# Patient Record
Sex: Female | Born: 1945
Health system: Southern US, Community
[De-identification: ages and names within clinical notes are randomized; demographics above are authoritative.]

## PROBLEM LIST (undated history)

## (undated) DIAGNOSIS — M1990 Unspecified osteoarthritis, unspecified site: Secondary | ICD-10-CM

## (undated) DIAGNOSIS — Z8669 Personal history of other diseases of the nervous system and sense organs: Secondary | ICD-10-CM

## (undated) DIAGNOSIS — T4145XA Adverse effect of unspecified anesthetic, initial encounter: Secondary | ICD-10-CM

## (undated) DIAGNOSIS — R112 Nausea with vomiting, unspecified: Secondary | ICD-10-CM

## (undated) DIAGNOSIS — Z9889 Other specified postprocedural states: Secondary | ICD-10-CM

## (undated) DIAGNOSIS — H269 Unspecified cataract: Secondary | ICD-10-CM

## (undated) DIAGNOSIS — M8580 Other specified disorders of bone density and structure, unspecified site: Secondary | ICD-10-CM

## (undated) DIAGNOSIS — G4700 Insomnia, unspecified: Secondary | ICD-10-CM

## (undated) DIAGNOSIS — T8859XA Other complications of anesthesia, initial encounter: Secondary | ICD-10-CM

## (undated) HISTORY — DX: Other specified disorders of bone density and structure, unspecified site: M85.80

## (undated) HISTORY — DX: Unspecified osteoarthritis, unspecified site: M19.90

## (undated) HISTORY — DX: Personal history of other diseases of the nervous system and sense organs: Z86.69

## (undated) HISTORY — PX: TUBAL LIGATION: SHX77

## (undated) HISTORY — DX: Unspecified cataract: H26.9

## (undated) HISTORY — PX: TONSILLECTOMY: SUR1361

## (undated) HISTORY — DX: Insomnia, unspecified: G47.00

---

## 2000-08-10 HISTORY — PX: BUNIONECTOMY: SHX129

## 2000-12-09 ENCOUNTER — Other Ambulatory Visit: Admission: RE | Admit: 2000-12-09 | Discharge: 2000-12-09 | Payer: Self-pay | Admitting: Internal Medicine

## 2001-11-13 ENCOUNTER — Other Ambulatory Visit: Admission: RE | Admit: 2001-11-13 | Discharge: 2001-11-13 | Payer: Self-pay | Admitting: Internal Medicine

## 2003-05-21 ENCOUNTER — Other Ambulatory Visit: Admission: RE | Admit: 2003-05-21 | Discharge: 2003-05-21 | Payer: Self-pay | Admitting: Internal Medicine

## 2003-06-11 ENCOUNTER — Encounter: Admission: RE | Admit: 2003-06-11 | Discharge: 2003-06-11 | Payer: Self-pay | Admitting: Internal Medicine

## 2003-06-11 ENCOUNTER — Encounter: Payer: Self-pay | Admitting: Internal Medicine

## 2003-12-13 ENCOUNTER — Encounter: Admission: RE | Admit: 2003-12-13 | Discharge: 2003-12-13 | Payer: Self-pay | Admitting: Internal Medicine

## 2004-06-06 ENCOUNTER — Other Ambulatory Visit: Admission: RE | Admit: 2004-06-06 | Discharge: 2004-06-06 | Payer: Self-pay | Admitting: Internal Medicine

## 2004-11-17 ENCOUNTER — Ambulatory Visit: Payer: Self-pay | Admitting: Sports Medicine

## 2004-12-18 ENCOUNTER — Encounter: Admission: RE | Admit: 2004-12-18 | Discharge: 2004-12-18 | Payer: Self-pay | Admitting: Internal Medicine

## 2005-07-05 ENCOUNTER — Other Ambulatory Visit: Admission: RE | Admit: 2005-07-05 | Discharge: 2005-07-05 | Payer: Self-pay | Admitting: Internal Medicine

## 2005-07-06 ENCOUNTER — Encounter: Admission: RE | Admit: 2005-07-06 | Discharge: 2005-07-06 | Payer: Self-pay | Admitting: Internal Medicine

## 2006-02-12 ENCOUNTER — Encounter: Admission: RE | Admit: 2006-02-12 | Discharge: 2006-02-12 | Payer: Self-pay | Admitting: Internal Medicine

## 2007-07-03 ENCOUNTER — Encounter: Admission: RE | Admit: 2007-07-03 | Discharge: 2007-07-03 | Payer: Self-pay | Admitting: Internal Medicine

## 2007-08-12 ENCOUNTER — Encounter: Admission: RE | Admit: 2007-08-12 | Discharge: 2007-08-12 | Payer: Self-pay | Admitting: Internal Medicine

## 2008-07-19 ENCOUNTER — Ambulatory Visit: Payer: Self-pay | Admitting: Internal Medicine

## 2008-07-19 ENCOUNTER — Other Ambulatory Visit: Admission: RE | Admit: 2008-07-19 | Discharge: 2008-07-19 | Payer: Self-pay | Admitting: Internal Medicine

## 2008-07-19 LAB — HM PAP SMEAR

## 2008-07-21 ENCOUNTER — Encounter: Admission: RE | Admit: 2008-07-21 | Discharge: 2008-07-21 | Payer: Self-pay | Admitting: Internal Medicine

## 2008-08-03 ENCOUNTER — Encounter: Admission: RE | Admit: 2008-08-03 | Discharge: 2008-08-03 | Payer: Self-pay | Admitting: Internal Medicine

## 2008-09-10 HISTORY — PX: CATARACT EXTRACTION: SUR2

## 2008-10-22 ENCOUNTER — Ambulatory Visit: Payer: Self-pay | Admitting: Internal Medicine

## 2008-11-11 ENCOUNTER — Ambulatory Visit: Payer: Self-pay | Admitting: Internal Medicine

## 2009-08-18 ENCOUNTER — Encounter: Admission: RE | Admit: 2009-08-18 | Discharge: 2009-08-18 | Payer: Self-pay | Admitting: Internal Medicine

## 2009-11-30 ENCOUNTER — Encounter: Admission: RE | Admit: 2009-11-30 | Discharge: 2009-11-30 | Payer: Self-pay | Admitting: Internal Medicine

## 2010-07-04 ENCOUNTER — Ambulatory Visit: Payer: Self-pay | Admitting: Internal Medicine

## 2010-08-08 ENCOUNTER — Ambulatory Visit: Payer: Self-pay | Admitting: Internal Medicine

## 2010-08-21 ENCOUNTER — Ambulatory Visit: Payer: Self-pay | Admitting: Internal Medicine

## 2010-08-24 ENCOUNTER — Encounter
Admission: RE | Admit: 2010-08-24 | Discharge: 2010-08-24 | Payer: Self-pay | Source: Home / Self Care | Attending: Internal Medicine | Admitting: Internal Medicine

## 2010-10-01 ENCOUNTER — Encounter: Payer: Self-pay | Admitting: Internal Medicine

## 2011-08-15 ENCOUNTER — Encounter: Payer: Self-pay | Admitting: Internal Medicine

## 2011-08-16 ENCOUNTER — Other Ambulatory Visit: Payer: Medicare Other | Admitting: Internal Medicine

## 2011-08-16 DIAGNOSIS — Z0000 Encounter for general adult medical examination without abnormal findings: Secondary | ICD-10-CM

## 2011-08-16 DIAGNOSIS — E559 Vitamin D deficiency, unspecified: Secondary | ICD-10-CM

## 2011-08-17 ENCOUNTER — Other Ambulatory Visit: Payer: Self-pay | Admitting: Internal Medicine

## 2011-08-17 LAB — COMPREHENSIVE METABOLIC PANEL
AST: 19 U/L (ref 0–37)
Albumin: 4.4 g/dL (ref 3.5–5.2)
Alkaline Phosphatase: 59 U/L (ref 39–117)
BUN: 16 mg/dL (ref 6–23)
CO2: 29 mEq/L (ref 19–32)
Calcium: 8.9 mg/dL (ref 8.4–10.5)
Chloride: 104 mEq/L (ref 96–112)
Creat: 0.91 mg/dL (ref 0.50–1.10)
Glucose, Bld: 71 mg/dL (ref 70–99)
Potassium: 4.8 mEq/L (ref 3.5–5.3)
Sodium: 140 mEq/L (ref 135–145)
Total Bilirubin: 0.9 mg/dL (ref 0.3–1.2)

## 2011-08-17 LAB — VITAMIN D 25 HYDROXY (VIT D DEFICIENCY, FRACTURES): Vit D, 25-Hydroxy: 43 ng/mL (ref 30–89)

## 2011-08-17 LAB — TSH: TSH: 2.099 u[IU]/mL (ref 0.350–4.500)

## 2011-08-17 LAB — CBC WITH DIFFERENTIAL/PLATELET
Basophils Absolute: 0.1 10*3/uL (ref 0.0–0.1)
Eosinophils Absolute: 0.2 10*3/uL (ref 0.0–0.7)
HCT: 41.6 % (ref 36.0–46.0)
MCHC: 33.2 g/dL (ref 30.0–36.0)
MCV: 94.3 fL (ref 78.0–100.0)
Neutrophils Relative %: 54 % (ref 43–77)
RBC: 4.41 MIL/uL (ref 3.87–5.11)

## 2011-08-20 ENCOUNTER — Encounter: Payer: Self-pay | Admitting: Internal Medicine

## 2011-08-20 ENCOUNTER — Ambulatory Visit (INDEPENDENT_AMBULATORY_CARE_PROVIDER_SITE_OTHER): Payer: Medicare Other | Admitting: Internal Medicine

## 2011-08-20 ENCOUNTER — Other Ambulatory Visit (HOSPITAL_COMMUNITY)
Admission: RE | Admit: 2011-08-20 | Discharge: 2011-08-20 | Disposition: A | Discharge status: Home / Self Care | Payer: Medicare Other | Source: Ambulatory Visit | Attending: Internal Medicine | Admitting: Internal Medicine

## 2011-08-20 VITALS — BP 114/66 | HR 76 | Temp 98.1°F | Ht 63.0 in | Wt 144.0 lb

## 2011-08-20 DIAGNOSIS — Z23 Encounter for immunization: Secondary | ICD-10-CM

## 2011-08-20 DIAGNOSIS — M8580 Other specified disorders of bone density and structure, unspecified site: Secondary | ICD-10-CM

## 2011-08-20 DIAGNOSIS — F419 Anxiety disorder, unspecified: Secondary | ICD-10-CM

## 2011-08-20 DIAGNOSIS — Z0000 Encounter for general adult medical examination without abnormal findings: Secondary | ICD-10-CM

## 2011-08-20 DIAGNOSIS — Z124 Encounter for screening for malignant neoplasm of cervix: Secondary | ICD-10-CM | POA: Insufficient documentation

## 2011-08-20 DIAGNOSIS — F458 Other somatoform disorders: Secondary | ICD-10-CM

## 2011-08-20 DIAGNOSIS — G4700 Insomnia, unspecified: Secondary | ICD-10-CM

## 2011-08-20 DIAGNOSIS — M26629 Arthralgia of temporomandibular joint, unspecified side: Secondary | ICD-10-CM

## 2011-08-20 LAB — POCT URINALYSIS DIPSTICK
Glucose, UA: NEGATIVE
Ketones, UA: NEGATIVE
Leukocytes, UA: NEGATIVE
Protein, UA: NEGATIVE
Spec Grav, UA: <=1.005
Urobilinogen, UA: NEGATIVE
pH, UA: 6.5

## 2011-09-10 NOTE — Patient Instructions (Signed)
Return in one year.

## 2011-09-12 ENCOUNTER — Other Ambulatory Visit: Payer: Self-pay | Admitting: Internal Medicine

## 2011-09-12 DIAGNOSIS — Z139 Encounter for screening, unspecified: Secondary | ICD-10-CM

## 2011-09-17 ENCOUNTER — Ambulatory Visit
Admission: RE | Admit: 2011-09-17 | Discharge: 2011-09-17 | Disposition: A | Discharge status: Home / Self Care | Payer: Medicare Other | Source: Ambulatory Visit | Attending: Internal Medicine | Admitting: Internal Medicine

## 2011-09-17 DIAGNOSIS — Z139 Encounter for screening, unspecified: Secondary | ICD-10-CM

## 2011-10-25 ENCOUNTER — Other Ambulatory Visit: Payer: Self-pay | Admitting: Dermatology

## 2011-11-14 ENCOUNTER — Encounter: Payer: Self-pay | Admitting: Internal Medicine

## 2011-11-14 NOTE — Progress Notes (Signed)
  Subjective:    Patient ID: Faith Miles, female    DOB: 05/11/1946, 66 y.o.   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 Had tubal ligation 1982. Tonsillectomy in childhood. Took estrogen replacement for many years. She is off that now. Is taking Fosamax for a number of years for osteopenia. History of glaucoma. Had left Bell's palsy August 2003. Colonoscopy by Dr. Juanda Chance October 2004. Last bone density study December 2008. History of surgery for bilateral bunions by Dr. Bari Edward December 2001.  Pneumovax vaccine 07/04/2010, Zostavax vaccine 08/08/2010. DTaP vaccine February 2011. Diagnosed by orthopedist with osteoarthritis left knee. Treated with Naprosyn. Patient tries to exercise with treadmill.  History of bruxism and TMJ syndrome. History of anxiety.  Family history mother died at age 18 with Alzheimer's disease.; Follow died at age 78 with diabetes and coronary artery disease. Both parents have hypertension. 3 sisters in good  health. One daughter and one son.  Social history patient drinks 1-2 ounces of wine or liquor daily. Has occasionally smoked in the remote past. Has 4 year college degree. Is married. Husband also worked for [employer] before retiring.    Review of Systems  Constitutional: Negative.   HENT: Negative.   Eyes: Negative.   Respiratory: Negative.   Gastrointestinal: Negative.   Genitourinary: Negative.   Musculoskeletal:       Knee pain  Neurological: Negative.   Psychiatric/Behavioral:       History of anxiety, insomnia, bruxism       Objective:   Physical Exam  Nursing note and vitals reviewed. Constitutional: She appears well-developed. No distress.  HENT:  Head: Normocephalic and atraumatic.  Right Ear: External ear normal.  Left Ear: External ear normal.  Mouth/Throat: Oropharynx is clear and moist.  Eyes: Conjunctivae and  EOM are normal. Pupils are equal, round, and reactive to light. Right eye exhibits no discharge. Left eye exhibits no discharge.  Neck: Normal range of motion. Neck supple. No JVD present. No thyromegaly present.  Cardiovascular: Normal rate, regular rhythm, normal heart sounds and intact distal pulses.   No murmur heard. Pulmonary/Chest: Effort normal and breath sounds normal. She has no wheezes. She has no rales.  Abdominal: Soft. Bowel sounds are normal. She exhibits no mass. There is no tenderness. There is no rebound.  Genitourinary: No vaginal discharge found.       Pap last done in November 2009. Pap taken today. Bimanual normal.  Musculoskeletal: Normal range of motion. She exhibits no edema.  Lymphadenopathy:    She has no cervical adenopathy.  Neurological: She is alert. She has normal reflexes. She displays normal reflexes. No cranial nerve deficit. Coordination normal.  Skin: Skin is warm and dry. She is not diaphoretic.  Psychiatric: She has a normal mood and affect. Her behavior is normal.          Assessment & Plan:  History of anxiety  History of bruxism and TMJ syndrome  History of osteopenia  History of glaucoma  Plan: Patient to have bone density study and mammogram. Colonoscopy due in 2014. Immunizations are up-to-date. Return one year or when necessary.

## 2012-07-25 ENCOUNTER — Encounter: Payer: Self-pay | Admitting: Internal Medicine

## 2012-07-25 ENCOUNTER — Ambulatory Visit (INDEPENDENT_AMBULATORY_CARE_PROVIDER_SITE_OTHER): Payer: Medicare Other | Admitting: Internal Medicine

## 2012-07-25 VITALS — BP 116/70 | HR 84 | Temp 100.0°F | Wt 144.0 lb

## 2012-07-25 DIAGNOSIS — J329 Chronic sinusitis, unspecified: Secondary | ICD-10-CM

## 2012-07-25 NOTE — Patient Instructions (Addendum)
Take Zithromax 2 tablets day one followed by 1 tablet days 2 through 5. Tessalon Perles 100 mg 2 tablets by mouth 3 times daily as needed for cough. Call if not better in one week or sooner if worse

## 2012-07-25 NOTE — Progress Notes (Signed)
  Subjective:    Patient ID: Faith Miles, female    DOB: 13-Feb-1946, 66 y.o.   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 Has had fever. Last night was cold but had no shaking chills. Temp is been up to 101. Has had some discolored nasal drainage. Ears feel a bit full. No sore throat. Cough is dry.   Review of Systems     Objective:   Physical Exam HEENT exam: TMs and pharynx are clear, neck is supple without significant adenopathy. Sounds nasally congested. Skin is warm and dry. Alert oriented x3. Chest clear to auscultation without rales or wheezing        Assessment & Plan:  Sinusitis  Plan: Zithromax Z-Pak take 2 tablets day one followed by 1 tablet days 2 through 5. Tessalon Perles 100 mg ( #60) 2 by mouth 3 times a day when necessary cough. Call if not better in one week. Rest and drink plenty of fluids. Has appointment for physical examination in December.

## 2012-08-28 ENCOUNTER — Other Ambulatory Visit: Payer: Medicare Other | Admitting: Internal Medicine

## 2012-08-29 ENCOUNTER — Encounter: Payer: Medicare Other | Admitting: Internal Medicine

## 2012-09-19 ENCOUNTER — Other Ambulatory Visit: Payer: Self-pay | Admitting: Internal Medicine

## 2012-09-19 DIAGNOSIS — Z1231 Encounter for screening mammogram for malignant neoplasm of breast: Secondary | ICD-10-CM

## 2012-10-03 ENCOUNTER — Other Ambulatory Visit: Payer: Medicare Other | Admitting: Internal Medicine

## 2012-10-06 ENCOUNTER — Encounter: Payer: Medicare Other | Admitting: Internal Medicine

## 2012-10-07 ENCOUNTER — Other Ambulatory Visit: Payer: Medicare Other | Admitting: Internal Medicine

## 2012-10-07 DIAGNOSIS — Z0000 Encounter for general adult medical examination without abnormal findings: Secondary | ICD-10-CM

## 2012-10-07 LAB — CBC WITH DIFFERENTIAL/PLATELET
Basophils Absolute: 0.1 10*3/uL (ref 0.0–0.1)
Basophils Relative: 1 % (ref 0–1)
HCT: 40.9 % (ref 36.0–46.0)
MCHC: 33.7 g/dL (ref 30.0–36.0)
MCV: 90.1 fL (ref 78.0–100.0)
Monocytes Absolute: 0.5 10*3/uL (ref 0.1–1.0)
Monocytes Relative: 8 % (ref 3–12)
Neutro Abs: 3.4 10*3/uL (ref 1.7–7.7)
RDW: 14 % (ref 11.5–15.5)
WBC: 6.1 10*3/uL (ref 4.0–10.5)

## 2012-10-07 LAB — LIPID PANEL
Cholesterol: 222 mg/dL — ABNORMAL HIGH (ref 0–200)
Total CHOL/HDL Ratio: 2.9 Ratio
VLDL: 24 mg/dL (ref 0–40)

## 2012-10-07 LAB — COMPREHENSIVE METABOLIC PANEL
ALT: 15 U/L (ref 0–35)
AST: 18 U/L (ref 0–37)
Alkaline Phosphatase: 65 U/L (ref 39–117)
BUN: 13 mg/dL (ref 6–23)
CO2: 30 mEq/L (ref 19–32)
Calcium: 9.3 mg/dL (ref 8.4–10.5)
Creat: 0.77 mg/dL (ref 0.50–1.10)
Sodium: 140 mEq/L (ref 135–145)
Total Protein: 6.9 g/dL (ref 6.0–8.3)

## 2012-10-09 ENCOUNTER — Ambulatory Visit (INDEPENDENT_AMBULATORY_CARE_PROVIDER_SITE_OTHER): Payer: Medicare Other | Admitting: Internal Medicine

## 2012-10-09 ENCOUNTER — Other Ambulatory Visit (HOSPITAL_COMMUNITY)
Admission: RE | Admit: 2012-10-09 | Discharge: 2012-10-09 | Disposition: A | Discharge status: Home / Self Care | Payer: Medicare Other | Source: Ambulatory Visit | Attending: Internal Medicine | Admitting: Internal Medicine

## 2012-10-09 ENCOUNTER — Ambulatory Visit: Payer: Medicare Other

## 2012-10-09 ENCOUNTER — Encounter: Payer: Self-pay | Admitting: Internal Medicine

## 2012-10-09 ENCOUNTER — Ambulatory Visit
Admission: RE | Admit: 2012-10-09 | Discharge: 2012-10-09 | Disposition: A | Discharge status: Home / Self Care | Payer: Medicare Other | Source: Ambulatory Visit | Attending: Internal Medicine | Admitting: Internal Medicine

## 2012-10-09 VITALS — BP 112/66 | HR 76 | Ht 63.0 in | Wt 147.5 lb

## 2012-10-09 DIAGNOSIS — E785 Hyperlipidemia, unspecified: Secondary | ICD-10-CM | POA: Insufficient documentation

## 2012-10-09 DIAGNOSIS — H04123 Dry eye syndrome of bilateral lacrimal glands: Secondary | ICD-10-CM

## 2012-10-09 DIAGNOSIS — H9190 Unspecified hearing loss, unspecified ear: Secondary | ICD-10-CM | POA: Insufficient documentation

## 2012-10-09 DIAGNOSIS — M899 Disorder of bone, unspecified: Secondary | ICD-10-CM

## 2012-10-09 DIAGNOSIS — H04129 Dry eye syndrome of unspecified lacrimal gland: Secondary | ICD-10-CM

## 2012-10-09 DIAGNOSIS — Z1231 Encounter for screening mammogram for malignant neoplasm of breast: Secondary | ICD-10-CM

## 2012-10-09 DIAGNOSIS — Z85828 Personal history of other malignant neoplasm of skin: Secondary | ICD-10-CM | POA: Insufficient documentation

## 2012-10-09 DIAGNOSIS — E559 Vitamin D deficiency, unspecified: Secondary | ICD-10-CM

## 2012-10-09 DIAGNOSIS — R8290 Unspecified abnormal findings in urine: Secondary | ICD-10-CM

## 2012-10-09 DIAGNOSIS — Z0000 Encounter for general adult medical examination without abnormal findings: Secondary | ICD-10-CM

## 2012-10-09 DIAGNOSIS — F419 Anxiety disorder, unspecified: Secondary | ICD-10-CM

## 2012-10-09 DIAGNOSIS — R319 Hematuria, unspecified: Secondary | ICD-10-CM | POA: Insufficient documentation

## 2012-10-09 DIAGNOSIS — F411 Generalized anxiety disorder: Secondary | ICD-10-CM

## 2012-10-09 DIAGNOSIS — R82998 Other abnormal findings in urine: Secondary | ICD-10-CM

## 2012-10-09 DIAGNOSIS — M26629 Arthralgia of temporomandibular joint, unspecified side: Secondary | ICD-10-CM

## 2012-10-09 DIAGNOSIS — M8580 Other specified disorders of bone density and structure, unspecified site: Secondary | ICD-10-CM

## 2012-10-09 LAB — POCT URINALYSIS DIPSTICK
Bilirubin, UA: NEGATIVE
Glucose, UA: NEGATIVE
Leukocytes, UA: NEGATIVE
Nitrite, UA: NEGATIVE
Urobilinogen, UA: NEGATIVE

## 2012-10-09 NOTE — Patient Instructions (Addendum)
Watch diet and exercise. Take 2000 units Vitamin D3 over the counter daily. Have colonoscopy this fall. Return in one year. Urine is being evaluated because of occult blood.  Addendum: Urine microscopic shows no evidence of significant hematuria. Urine culture shows no growth. Reevaluate in one year.

## 2012-10-09 NOTE — Progress Notes (Signed)
Subjective:    Patient ID: Faith Miles, female    DOB: May 10, 1946, 67 y.o.   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 She had tubal ligation 1982. Tonsillectomy in childhood. She took estrogen replacement for many years but currently is off that. Took Fosamax for osteopenia. History of glaucoma. Had left Bell's palsy August 2003. Colonoscopy done by Dr. Juanda Chance October 2004. Last bone density study December 2008. History of surgery for bilateral bunions by podiatrist in 2001.  Pneumovax vaccine October 2011. Zostavax vaccine November 2011. Tetanus immunization February 2011.  Has been diagnosed by orthopedist with osteoarthritis of her left knee. Treated with Naprosyn. Tries to exercise with treadmill.  History of anxiety, bruxism TMJ syndrome.  Family history: Mother died at age 33 with Alzheimer's disease. Father died at age 73 with diabetes and coronary disease. Both parents with history of hypertension. 3 sisters in good health. One daughter is bipolar. Has one son who is well.  Social history: Drinks one or 2 ounces of wine or liquor daily. Has occasionally smoked in the remote past. 4 year college degree. Is married. Husband also retired and formerly worked for [employer] before retiring.  Has recently been diagnosed with hearing loss and will be getting hearing aids from [employer].  Diagnosed with squamous cell carcinoma right leg by dermatologist February 2013.  Last Pap 2012   Review of Systems  Constitutional: Negative.   All other systems reviewed and are negative.       Objective:   Physical Exam  Vitals reviewed. Constitutional: She is oriented to person, place, and time. She appears well-developed and well-nourished. No distress.  HENT:  Head: Normocephalic and atraumatic.  Right Ear: External ear normal.  Left Ear: External ear normal.  Mouth/Throat: Oropharynx is clear and moist.  No oropharyngeal exudate.  Eyes: Conjunctivae normal and EOM are normal. Pupils are equal, round, and reactive to light. Right eye exhibits no discharge. Left eye exhibits no discharge. No scleral icterus.  Neck: Neck supple. No JVD present. No thyromegaly present.  Cardiovascular: Normal rate, regular rhythm, normal heart sounds and intact distal pulses.   No murmur heard. Pulmonary/Chest: Effort normal and breath sounds normal. No respiratory distress. She has no wheezes. She has no rales. She exhibits no tenderness.  Abdominal: Soft. Bowel sounds are normal. She exhibits no distension and no mass. There is no tenderness. There is no rebound and no guarding.  Genitourinary:        Pap deferred. Last done 2012  Musculoskeletal: Normal range of motion. She exhibits no edema.  Lymphadenopathy:    She has no cervical adenopathy.  Neurological: She is alert and oriented to person, place, and time. She has normal reflexes. No cranial nerve deficit. Coordination normal.  Skin: Skin is warm and dry. No rash noted. She is not diaphoretic.  Psychiatric: She has a normal mood and affect. Her behavior is normal. Judgment and thought content normal.          Assessment & Plan:    Occult blood in urine on urine dipstick-microscopic urinalysis at [facility] lab, urine culture, urine cytology. She's had trace amounts of occult blood in urine in the past but not 2+ as she has today. She is asymptomatic. Consider urology consultation. Not taking aspirin Aleve or Advil. Is menopausal.  Hyperlipidemia-recommend diet and exercise and recheck in one year  History of basal cell carcinoma behind right ear and leg (Feb 2013) followed  by Dr. Yetta Barre at [facility]  Hearing loss- to get hearing aids at [facility]  Vitamin D deficiency -- take 2000 units vitamin D 3 daily over-the-counter   History of anxiety  History of TMJ syndrome  Mild osteopenia-positive response to Fosamax. Recommend now that she  take calcium and vitamin D. Last bone density 2011. Should have another bone density study 2014.    Addendum: Urine was sent to [facility] lab for microscopic urinalysis showing no significant hematuria. Culture was no growth. Therefore do not feel further intervention is necessary at this point in time.  Subjective:   Patient presents for Medicare Annual/Subsequent preventive examination.   Review Past Medical/Family/Social:   Risk Factors  Current exercise habits: sedentary except for walking Dietary issues discussed: low fat low carb  Cardiac risk factors: hyperlipidemia  Depression Screen  (Note: if answer to either of the following is "Yes", a more complete depression screening is indicated)   Over the past two weeks, have you felt down, depressed or hopeless? No  Over the past two weeks, have you felt little interest or pleasure in doing things? No Have you lost interest or pleasure in daily life? No Do you often feel hopeless? No Do you cry easily over simple problems? No   Activities of Daily Living  In your present state of health, do you have any difficulty performing the following activities?:   Driving? No  Managing money? No  Feeding yourself? No  Getting from bed to chair? No  Climbing a flight of stairs? No  Preparing food and eating?: No  Bathing or showering? No  Getting dressed: No  Getting to the toilet? No  Using the toilet:No  Moving around from place to place: No  In the past year have you fallen or had a near fall?:No  Are you sexually active? No  Do you have more than one partner? No   Hearing Difficulties: No  Do you often ask people to speak up or repeat themselves? Yes- obtaining hearing aids at [facility]  Do you experience ringing or noises in your ears? No  Do you have difficulty understanding soft or whispered voices? yes Do you feel that you have a problem with memory? No Do you often misplace items? No    Home Safety:  Do you have a  smoke alarm at your residence? Yes Do you have grab bars in the bathroom? yes Do you have throw rugs in your house? yes   Cognitive Testing  Alert? Yes Normal Appearance?Yes  Oriented to person? Yes Place? Yes  Time? Yes  Recall of three objects? Yes  Can perform simple calculations? Yes  Displays appropriate judgment?Yes  Can read the correct time from a watch face?Yes   List the Names of Other Physician/Practitioners you currently use:  See referral list for the physicians patient is currently seeing. Dr. Yetta Barre- Dermatology; Dr. Juanda Chance for routine colonoscopy due Oct 2014    Review of Systems:   Objective:     General appearance: Appears stated age in Head: Normocephalic, without obvious abnormality, atraumatic  Eyes: conj clear, EOMi PEERLA  Ears: normal TM's and external ear canals both ears  Nose: Nares normal. Septum midline. Mucosa normal. No drainage or sinus tenderness.  Throat: lips, mucosa, and tongue normal; teeth and gums normal  Neck: no adenopathy, no carotid bruit, no JVD, supple, symmetrical, trachea midline and thyroid not enlarged, symmetric, no tenderness/mass/nodules  No CVA tenderness.  Lungs: clear to auscultation bilaterally  Breasts: normal appearance, no  masses or tenderness,  Heart: regular rate and rhythm, S1, S2 normal, no murmur, click, rub or gallop  Abdomen: soft, non-tender; bowel sounds normal; no masses, no organomegaly  Musculoskeletal: ROM normal in all joints, no crepitus, no deformity, Normal muscle strengthen. Back  is symmetric, no curvature. Skin: Skin color, texture, turgor normal. No rashes or lesions  Lymph nodes: Cervical, supraclavicular, and axillary nodes normal.  Neurologic: CN 2 -12 Normal, Normal symmetric reflexes. Normal coordination and gait  Psych: Alert & Oriented x 3, Mood appear stable.    Assessment:    Annual wellness medicare exam   Plan:    During the course of the visit the patient was educated and  counseled about appropriate screening and preventive services including:   Mammogram to be done today and colonoscopy due Fall 2014     Patient Instructions (the written plan) was given to the patient.  Medicare Attestation  I have personally reviewed:  The patient's medical and social history  Their use of alcohol, tobacco or illicit drugs  Their current medications and supplements  The patient's functional ability including ADLs,fall risks, home safety risks, cognitive, and hearing and visual impairment  Diet and physical activities  Evidence for depression or mood disorders  The patient's weight, height, BMI, and visual acuity have been recorded in the chart. I have made referrals, counseling, and provided education to the patient based on review of the above and I have provided the patient with a written personalized care plan for preventive services.

## 2012-10-10 LAB — URINALYSIS, MICROSCOPIC ONLY: Crystals: NONE SEEN

## 2012-10-10 NOTE — Progress Notes (Signed)
Patient informed. 

## 2012-10-11 LAB — URINE CULTURE
Colony Count: NO GROWTH
Organism ID, Bacteria: NO GROWTH

## 2012-10-12 DIAGNOSIS — H04123 Dry eye syndrome of bilateral lacrimal glands: Secondary | ICD-10-CM | POA: Insufficient documentation

## 2012-10-12 DIAGNOSIS — M8580 Other specified disorders of bone density and structure, unspecified site: Secondary | ICD-10-CM | POA: Insufficient documentation

## 2012-10-12 DIAGNOSIS — F419 Anxiety disorder, unspecified: Secondary | ICD-10-CM | POA: Insufficient documentation

## 2012-10-12 DIAGNOSIS — M26629 Arthralgia of temporomandibular joint, unspecified side: Secondary | ICD-10-CM | POA: Insufficient documentation

## 2012-10-14 ENCOUNTER — Telehealth: Payer: Self-pay | Admitting: Internal Medicine

## 2012-10-14 NOTE — Telephone Encounter (Signed)
Patient called about results of urine tests. Returned call but could got answer machine. Left message that there is no significant blood in her urine on urine microscopic. Urine cytology is negative. Urine culture is negative.

## 2012-11-21 ENCOUNTER — Telehealth: Payer: Self-pay | Admitting: Internal Medicine

## 2012-11-21 NOTE — Telephone Encounter (Signed)
I do not recommend

## 2013-03-28 ENCOUNTER — Emergency Department (HOSPITAL_COMMUNITY)
Admission: EM | Admit: 2013-03-28 | Discharge: 2013-03-28 | Disposition: A | Discharge status: Home / Self Care | Payer: BC Managed Care – HMO | Source: Home / Self Care | Attending: Emergency Medicine | Admitting: Emergency Medicine

## 2013-03-28 ENCOUNTER — Encounter (HOSPITAL_COMMUNITY): Payer: Self-pay | Admitting: Emergency Medicine

## 2013-03-28 DIAGNOSIS — IMO0002 Reserved for concepts with insufficient information to code with codable children: Secondary | ICD-10-CM

## 2013-03-28 DIAGNOSIS — T148XXA Other injury of unspecified body region, initial encounter: Secondary | ICD-10-CM

## 2013-03-28 NOTE — ED Notes (Signed)
Reports cut to right pinky finger today around 3 p.m states cut finger while using pruner Pt states that she does not remember when she had her last tetanus shot Bleeding is controlled and finger wrapped in tape bandage.

## 2013-03-28 NOTE — ED Provider Notes (Signed)
Chief Complaint:   Chief Complaint  Patient presents with  . Laceration    cut to right pinky finger    History of Present Illness:   Faith Miles is a 67 year old female who cut her little finger around 3:30 this afternoon with a pitch clippers. She has a small, shallow laceration on the tip of the finger. She has full range of motion of all her digits. Sensation is intact. Her last tetanus vaccine was in 2011.  Review of Systems:  Other than noted above, the patient denies any of the following symptoms: Systemic:  No fever or chills. Musculoskeletal:  No joint pain or decreased range of motion. Neuro:  No numbness, tingling, or weakness.  PMFSH:  Past medical history, family history, social history, meds, and allergies were reviewed.   Physical Exam:   Vital signs:  BP 148/82  Pulse 66  Temp(Src) 98.2 F (36.8 C) (Oral)  Resp 17  SpO2 96% Ext:  There is a 1.5 cm laceration on the ulnar aspect of the right little finger, at the tip of the finger.  All joints have full range of motion and sensation is intact on the tip of the finger. All other joints had a full ROM without pain.  Pulses were full.  Good capillary refill in all digits.  No edema. Neurological:  Alert and oriented.  No muscle weakness.  Sensation was intact to light touch.   Procedure: Verbal informed consent was obtained.  The patient was informed of the risks and benefits of the procedure and understands and accepts.  Identity of the patient was verified verbally and by wristband.   The laceration area described above was prepped with Betadine and anesthetized with 2 mL of 2% Xylocaine without epinephrine.  The wound was then closed as follows:  Wound edges were approximated with 6 5-0 nylon sutures.  There were no immediate complications, and the patient tolerated the procedure well. The laceration was then cleansed, Bacitracin ointment was applied and a clean, dry pressure dressing was put on.   Assessment:  The  encounter diagnosis was Laceration.  Plan:   1.  The following meds were prescribed:   Discharge Medication List as of 03/28/2013  6:02 PM     2.  The patient was instructed in wound care and pain control, and handouts were given. 3.  The patient was told to return in 14 days for suture removal or wound recheck or sooner if any sign of infection.     Reuben Likes, MD 03/28/13 [phone]

## 2013-04-10 ENCOUNTER — Telehealth: Payer: Self-pay

## 2013-04-10 ENCOUNTER — Ambulatory Visit: Payer: Self-pay | Admitting: Internal Medicine

## 2013-04-10 NOTE — Telephone Encounter (Signed)
Need to know when they were placed.

## 2013-04-10 NOTE — Telephone Encounter (Signed)
Patient declines OV at 2:30 today. Needs to be out of town later today

## 2013-04-10 NOTE — Telephone Encounter (Signed)
pt went to urgent care an had 6 stiches put in pinky finger an are ready to be removed an wanting to see if possible she may come by today to get them removed

## 2013-04-12 ENCOUNTER — Emergency Department (INDEPENDENT_AMBULATORY_CARE_PROVIDER_SITE_OTHER)
Admission: EM | Admit: 2013-04-12 | Discharge: 2013-04-12 | Disposition: A | Discharge status: Home / Self Care | Payer: Medicare Other | Source: Home / Self Care | Attending: Emergency Medicine | Admitting: Emergency Medicine

## 2013-04-12 ENCOUNTER — Encounter (HOSPITAL_COMMUNITY): Payer: Self-pay | Admitting: *Deleted

## 2013-04-12 DIAGNOSIS — T148XXA Other injury of unspecified body region, initial encounter: Secondary | ICD-10-CM

## 2013-04-12 DIAGNOSIS — IMO0002 Reserved for concepts with insufficient information to code with codable children: Secondary | ICD-10-CM

## 2013-04-12 NOTE — ED Notes (Signed)
Presents for suture removal left 5th finger, placed 7/19.  Wound well-approximated without S/S infection.

## 2013-04-12 NOTE — ED Notes (Signed)
Sutures removed per Dr. Lorenz Coaster.

## 2013-04-12 NOTE — ED Provider Notes (Signed)
Chief Complaint:   Chief Complaint  Patient presents with  . Suture / Staple Removal    History of Present Illness:   Faith Miles is a 67 year old female who underwent laceration repair to her left little finger 2 weeks ago. She returns today for suture removal. It's healing up well with no evidence of infection.  Review of Systems:  Other than noted above, the patient denies any of the following symptoms: Systemic:  No fever or chills. Musculoskeletal:  No joint pain or decreased range of motion. Neuro:  No numbness, tingling, or weakness.  PMFSH:  Past medical history, family history, social history, meds, and allergies were reviewed.   Physical Exam:   Vital signs:  BP 124/76  Pulse 75  Temp(Src) 98.6 F (37 C) (Oral)  Resp 15  SpO2 96% Ext:  Laceration of the finger is well healed, no evidence of infection.  All other joints had a full ROM without pain.  Pulses were full.  Good capillary refill in all digits.  No edema. Neurological:  Alert and oriented.  No muscle weakness.  Sensation was intact to light touch.   Procedure: Verbal informed consent was obtained.  The patient was informed of the risks and benefits of the procedure and understands and accepts.  Identity of the patient was verified verbally and by wristband. Sutures were prepped with alcohol and snipped out. Antibiotic ointment and Band-Aid dressing were applied.  Assessment:  The encounter diagnosis was Laceration.  Appears to have healed well. Do not foresee any further problems.  Plan:   1.  The following meds were prescribed:   Discharge Medication List as of 04/12/2013  2:53 PM     2.  The patient was instructed in wound care and pain control, and handouts were given. 3.  The patient was told to return if any sign of infection.     Reuben Likes, MD 04/12/13 2113

## 2013-04-20 ENCOUNTER — Encounter: Payer: Self-pay | Admitting: Internal Medicine

## 2013-05-04 ENCOUNTER — Encounter: Payer: Self-pay | Admitting: Internal Medicine

## 2013-06-18 ENCOUNTER — Ambulatory Visit (AMBULATORY_SURGERY_CENTER): Payer: Self-pay | Admitting: *Deleted

## 2013-06-18 VITALS — Ht 63.0 in | Wt 146.0 lb

## 2013-06-18 DIAGNOSIS — Z1211 Encounter for screening for malignant neoplasm of colon: Secondary | ICD-10-CM

## 2013-06-18 MED ORDER — MOVIPREP 100 G PO SOLR: 1.0000 | Freq: Once | ORAL | Status: DC

## 2013-06-18 NOTE — Progress Notes (Signed)
No egg or soy allergy. No anesthesia problems. Sick after cataract surgery from anesthesia.

## 2013-06-19 ENCOUNTER — Encounter: Payer: Self-pay | Admitting: Internal Medicine

## 2013-06-22 ENCOUNTER — Encounter: Payer: Self-pay | Admitting: *Deleted

## 2013-06-22 ENCOUNTER — Telehealth: Payer: Self-pay | Admitting: *Deleted

## 2013-06-22 NOTE — Telephone Encounter (Signed)
Informed pt that heer shart had been placed in storage off the premises, but I gave her the 3 like medications we prescribed after surgery - Vicodin, Oxycontin, or Demerol.

## 2013-06-22 NOTE — Telephone Encounter (Signed)
Pt states calling concerning a adverse reaction to a medication, and was considering surgery again.  Returned pt call [phone] lefft message to call again.

## 2013-06-22 NOTE — Telephone Encounter (Signed)
When I had my bunionectomy in 2001 I had a reaction to the pain medicine right after surgery, my face turned real red.  I told pt I would see if I could find the chart and call again.

## 2013-07-09 ENCOUNTER — Encounter: Payer: Self-pay | Admitting: Internal Medicine

## 2013-07-10 ENCOUNTER — Encounter: Payer: Medicare Other | Admitting: Internal Medicine

## 2013-08-25 ENCOUNTER — Ambulatory Visit (AMBULATORY_SURGERY_CENTER): Payer: Self-pay | Admitting: *Deleted

## 2013-08-25 VITALS — Ht 64.0 in | Wt 153.6 lb

## 2013-08-25 DIAGNOSIS — Z1211 Encounter for screening for malignant neoplasm of colon: Secondary | ICD-10-CM

## 2013-08-25 NOTE — Progress Notes (Signed)
No allergies to eggs or soy. No problems with anesthesia.  

## 2013-09-01 ENCOUNTER — Encounter: Payer: Self-pay | Admitting: Internal Medicine

## 2013-09-15 ENCOUNTER — Ambulatory Visit (AMBULATORY_SURGERY_CENTER): Payer: Medicare Other | Admitting: Internal Medicine

## 2013-09-15 ENCOUNTER — Encounter: Payer: Self-pay | Admitting: Internal Medicine

## 2013-09-15 VITALS — BP 124/74 | HR 66 | Temp 97.7°F | Resp 25 | Ht 64.0 in | Wt 153.0 lb

## 2013-09-15 DIAGNOSIS — Z1211 Encounter for screening for malignant neoplasm of colon: Secondary | ICD-10-CM

## 2013-09-15 MED ORDER — SODIUM CHLORIDE 0.9 % IV SOLN: 500.0000 mL | INTRAVENOUS | Status: DC

## 2013-09-15 NOTE — Patient Instructions (Signed)

## 2013-09-15 NOTE — Op Note (Signed)
[place]  [address]. [place], [address]   COLONOSCOPY PROCEDURE REPORT  PATIENT: Faith Miles, Faith Miles  MR#: [id number] BIRTHDATE: August 28, 1946 , 67  yrs. old GENDER: Female ENDOSCOPIST: Lafayette Dragon, MD REFERRED XB:JYNW Parke Simmers, M.D. PROCEDURE DATE:  09/15/2013 PROCEDURE:   Colonoscopy, screening First Screening Colonoscopy - Avg.  risk and is 50 yrs.  old or older - No.  Prior Negative Screening - Now for repeat screening. 10 or more years since last screening  History of Adenoma - Now for follow-up colonoscopy & has been > or = to 3 yrs.  N/A  Polyps Removed Today? No.  Recommend repeat exam, <10 yrs? No. ASA CLASS:   Class II INDICATIONS:Average risk patient for colon cancer. , last colonoscopy in October 2004 was a normal exam MEDICATIONS: MAC sedation, administered by CRNA and propofol (Diprivan) 150mg  IV  DESCRIPTION OF PROCEDURE:   After the risks benefits and alternatives of the procedure were thoroughly explained, informed consent was obtained.  A digital rectal exam revealed no abnormalities of the rectum.   The LB [id number]  endoscope was introduced through the anus and advanced to the cecum, which was identified by both the appendix and ileocecal valve. No adverse events experienced.   The quality of the prep was excellent, using MoviPrep  The instrument was then slowly withdrawn as the colon was fully examined.      COLON FINDINGS: Small internal hemorrhoids were found.  Retroflexed views revealed no abnormalities. The time to cecum=6 minutes 38 seconds.  Withdrawal time=6 minutes 10 seconds.  The scope was withdrawn and the procedure completed. COMPLICATIONS: There were no complications.  ENDOSCOPIC IMPRESSION: Small internal hemorrhoids  RECOMMENDATIONS: high fiber diet Recall colonoscopy in 10 years   eSigned:  Lafayette Dragon, MD 09/15/2013 9:02 AM   cc:

## 2013-09-15 NOTE — Progress Notes (Signed)
A/ox3 pleased with MAC, report to Wendy RN 

## 2013-09-16 ENCOUNTER — Telehealth: Payer: Self-pay | Admitting: *Deleted

## 2013-09-16 NOTE — Telephone Encounter (Signed)
  Follow up Call-  Call back number 09/15/2013  Post procedure Call Back phone  # [phone]  Permission to leave phone message Yes     Patient questions:  Do you have a fever, pain , or abdominal swelling? no Pain Score  0 *  Have you tolerated food without any problems? yes  Have you been able to return to your normal activities? yes  Do you have any questions about your discharge instructions: Diet   no Medications  no Follow up visit  no  Do you have questions or concerns about your Care? no  Actions: * If pain score is 4 or above: No action needed, pain <4.

## 2013-10-21 ENCOUNTER — Other Ambulatory Visit: Payer: Self-pay

## 2013-10-21 DIAGNOSIS — Z1231 Encounter for screening mammogram for malignant neoplasm of breast: Secondary | ICD-10-CM

## 2013-10-22 ENCOUNTER — Ambulatory Visit
Admission: RE | Admit: 2013-10-22 | Discharge: 2013-10-22 | Disposition: A | Discharge status: Home / Self Care | Payer: Medicare Other | Source: Ambulatory Visit

## 2013-10-22 DIAGNOSIS — Z1231 Encounter for screening mammogram for malignant neoplasm of breast: Secondary | ICD-10-CM

## 2013-11-06 ENCOUNTER — Other Ambulatory Visit: Payer: Medicare Other | Admitting: Internal Medicine

## 2013-11-06 DIAGNOSIS — Z130 Encounter for screening for diseases of the blood and blood-forming organs and certain disorders involving the immune mechanism: Secondary | ICD-10-CM

## 2013-11-06 DIAGNOSIS — Z0000 Encounter for general adult medical examination without abnormal findings: Secondary | ICD-10-CM

## 2013-11-06 DIAGNOSIS — Z13228 Encounter for screening for other metabolic disorders: Secondary | ICD-10-CM

## 2013-11-06 DIAGNOSIS — Z1329 Encounter for screening for other suspected endocrine disorder: Secondary | ICD-10-CM

## 2013-11-06 DIAGNOSIS — E785 Hyperlipidemia, unspecified: Secondary | ICD-10-CM

## 2013-11-06 LAB — COMPREHENSIVE METABOLIC PANEL
ALK PHOS: 56 U/L (ref 39–117)
ALT: 17 U/L (ref 0–35)
AST: 20 U/L (ref 0–37)
Albumin: 4.4 g/dL (ref 3.5–5.2)
BUN: 14 mg/dL (ref 6–23)
CO2: 28 meq/L (ref 19–32)
Calcium: 9.3 mg/dL (ref 8.4–10.5)
Chloride: 105 mEq/L (ref 96–112)
Creat: 0.69 mg/dL (ref 0.50–1.10)
GLUCOSE: 90 mg/dL (ref 70–99)
POTASSIUM: 4.5 meq/L (ref 3.5–5.3)
Sodium: 138 mEq/L (ref 135–145)
TOTAL PROTEIN: 7 g/dL (ref 6.0–8.3)
Total Bilirubin: 1.4 mg/dL — ABNORMAL HIGH (ref 0.2–1.2)

## 2013-11-06 LAB — CBC WITH DIFFERENTIAL/PLATELET
BASOS PCT: 1 % (ref 0–1)
Basophils Absolute: 0 10*3/uL (ref 0.0–0.1)
Eosinophils Absolute: 0.1 10*3/uL (ref 0.0–0.7)
Eosinophils Relative: 2 % (ref 0–5)
HCT: 40.9 % (ref 36.0–46.0)
Hemoglobin: 13.9 g/dL (ref 12.0–15.0)
LYMPHS ABS: 1.6 10*3/uL (ref 0.7–4.0)
Lymphocytes Relative: 36 % (ref 12–46)
MCH: 30.8 pg (ref 26.0–34.0)
MCHC: 34 g/dL (ref 30.0–36.0)
MCV: 90.5 fL (ref 78.0–100.0)
MONOS PCT: 7 % (ref 3–12)
Monocytes Absolute: 0.3 10*3/uL (ref 0.1–1.0)
NEUTROS ABS: 2.4 10*3/uL (ref 1.7–7.7)
NEUTROS PCT: 54 % (ref 43–77)
Platelets: 331 10*3/uL (ref 150–400)
RBC: 4.52 MIL/uL (ref 3.87–5.11)
RDW: 14.4 % (ref 11.5–15.5)
WBC: 4.4 10*3/uL (ref 4.0–10.5)

## 2013-11-06 LAB — LIPID PANEL
Cholesterol: 200 mg/dL (ref 0–200)
HDL: 76 mg/dL (ref >39)
LDL Cholesterol: 108 mg/dL — ABNORMAL HIGH (ref 0–99)
TRIGLYCERIDES: 78 mg/dL (ref <150)
Total CHOL/HDL Ratio: 2.6 Ratio
VLDL: 16 mg/dL (ref 0–40)

## 2013-11-06 LAB — TSH: TSH: 1.761 u[IU]/mL (ref 0.350–4.500)

## 2013-11-07 LAB — VITAMIN D 25 HYDROXY (VIT D DEFICIENCY, FRACTURES): Vit D, 25-Hydroxy: 43 ng/mL (ref 30–89)

## 2013-11-09 ENCOUNTER — Encounter: Payer: Self-pay | Admitting: Internal Medicine

## 2013-11-09 ENCOUNTER — Ambulatory Visit (INDEPENDENT_AMBULATORY_CARE_PROVIDER_SITE_OTHER): Payer: Medicare Other | Admitting: Internal Medicine

## 2013-11-09 ENCOUNTER — Other Ambulatory Visit (HOSPITAL_COMMUNITY)
Admission: RE | Admit: 2013-11-09 | Discharge: 2013-11-09 | Disposition: A | Discharge status: Home / Self Care | Payer: Medicare Other | Source: Ambulatory Visit | Attending: Internal Medicine | Admitting: Internal Medicine

## 2013-11-09 VITALS — BP 112/68 | HR 72 | Temp 98.5°F | Ht 63.25 in | Wt 147.0 lb

## 2013-11-09 DIAGNOSIS — F411 Generalized anxiety disorder: Secondary | ICD-10-CM

## 2013-11-09 DIAGNOSIS — H9190 Unspecified hearing loss, unspecified ear: Secondary | ICD-10-CM

## 2013-11-09 DIAGNOSIS — H9193 Unspecified hearing loss, bilateral: Secondary | ICD-10-CM

## 2013-11-09 DIAGNOSIS — M949 Disorder of cartilage, unspecified: Secondary | ICD-10-CM

## 2013-11-09 DIAGNOSIS — Z01419 Encounter for gynecological examination (general) (routine) without abnormal findings: Secondary | ICD-10-CM | POA: Insufficient documentation

## 2013-11-09 DIAGNOSIS — M8580 Other specified disorders of bone density and structure, unspecified site: Secondary | ICD-10-CM

## 2013-11-09 DIAGNOSIS — Z0000 Encounter for general adult medical examination without abnormal findings: Secondary | ICD-10-CM

## 2013-11-09 DIAGNOSIS — M899 Disorder of bone, unspecified: Secondary | ICD-10-CM

## 2013-11-09 DIAGNOSIS — H409 Unspecified glaucoma: Secondary | ICD-10-CM

## 2013-11-09 LAB — POCT URINALYSIS DIPSTICK
Bilirubin, UA: NEGATIVE
Glucose, UA: NEGATIVE
KETONES UA: NEGATIVE
Leukocytes, UA: NEGATIVE
NITRITE UA: NEGATIVE
Protein, UA: NEGATIVE
Spec Grav, UA: 1.01
UROBILINOGEN UA: NEGATIVE
pH, UA: 6

## 2013-11-09 NOTE — Patient Instructions (Signed)
Continue diet exercise. Return in one year. Have bone density study and call Dr. Olevia Perches regarding repeat colonoscopy

## 2013-11-09 NOTE — Progress Notes (Signed)
Subjective:    Patient ID: Faith Miles, female    DOB: 1945-10-14, 68 y.o.   MRN: [id number]  HPI 68 year old White female with hearing loss now wearing bilateral hearing aids. Patient is very healthy and stays very active with no new complaints.  This history: She had tubal ligation 1982. Tonsillectomy in childhood. History of glaucoma. Had left Bell's palsy August 2003. Colonoscopy done by Dr. Olevia Perches October 2004. Last bone density study December 2011. Surgery for bilateral bunions by podiatrist in 2001. History of anxiety. History of bruxism and TMJ syndrome.  Pneumovax vaccine October 2011. Zostavax vaccine November 2011. Tetanus immunization February 2011.  Squamous cell carcinoma right leg treated by dermatologist February 2013.  History of basal cell carcinoma behind right ear in leg in February 2013. She's followed by Dr. Ronnald Ramp at  [facility].  Family history: Mother died at age 88 with Alzheimer's disease. Father died at age 12 with diabetes and coronary disease. Both parents with history of hypertension. 3 sisters in good health. One daughter is bipolar. One son who is well.  Social history: Drinks one or 2 ounces of wine or like her daily. Has occasionally smoked in the remote past. She has a 4 year college degree. Is married. She and her husband have both retired from [employer].  Last Pap 2012  Has been diagnosed by orthopedist with osteoarthritis of her left knee.    Review of Systems  Constitutional: Negative.   HENT: Negative.   Eyes: Negative.   Respiratory: Negative.   Cardiovascular: Negative.   Gastrointestinal: Negative.   Endocrine: Negative.   Genitourinary: Negative.   Neurological: Negative.   Hematological: Negative.   Psychiatric/Behavioral: Negative.   All other systems reviewed and are negative.       Objective:   Physical Exam  Vitals reviewed. Constitutional: She is oriented to person, place, and time. She appears  well-developed and well-nourished. No distress.  HENT:  Head: Normocephalic and atraumatic.  Right Ear: External ear normal.  Left Ear: External ear normal.  Mouth/Throat: Oropharynx is clear and moist. No oropharyngeal exudate.  Eyes: Conjunctivae and EOM are normal. Pupils are equal, round, and reactive to light. Right eye exhibits no discharge. Left eye exhibits no discharge. No scleral icterus.  Neck: Neck supple. No JVD present. No thyromegaly present.  Cardiovascular: Normal rate, regular rhythm, normal heart sounds and intact distal pulses.   No murmur heard. Pulmonary/Chest: Effort normal and breath sounds normal. No respiratory distress. She has no wheezes. She has no rales. She exhibits no tenderness.  Breasts normal female  Abdominal: Soft. Bowel sounds are normal. She exhibits no distension and no mass. There is no tenderness. There is no rebound and no guarding.  Genitourinary: Vagina normal.  Pap taken, bimanual normal  Musculoskeletal: Normal range of motion. She exhibits no edema.  Lymphadenopathy:    She has no cervical adenopathy.  Neurological: She is alert and oriented to person, place, and time. She has normal reflexes. She displays normal reflexes. No cranial nerve deficit.  Skin: Skin is warm and dry. No rash noted. She is not diaphoretic.  Psychiatric: She has a normal mood and affect. Her behavior is normal. Judgment and thought content normal.          Assessment & Plan:  History of skin cancer  History of elevated LDL cholesterol-now improved since last year  Bilateral hearing loss now wearing bilateral hearing aids  History of anxiety  History of dry eyes  History  of glaucoma followed by Dr. Bing Plume  Plan: Return in one year or as needed. Colonoscopy  due-patient should contact Dr. Olevia Perches. Subjective:   Patient presents for Medicare Annual/Subsequent preventive examination.   Review Past Medical/Family/Social:  No changes   Risk Factors    Current exercise habits: pilates and has personal trainer and walks Dietary issues discussed: low fat low carb  Cardiac risk factors: None  Depression Screen  (Note: if answer to either of the following is "Yes", a more complete depression screening is indicated)   Over the past two weeks, have you felt down, depressed or hopeless? No  Over the past two weeks, have you felt little interest or pleasure in doing things? No Have you lost interest or pleasure in daily life? No Do you often feel hopeless? No Do you cry easily over simple problems? No   Activities of Daily Living  In your present state of health, do you have any difficulty performing the following activities?:   Driving? No  Managing money? No  Feeding yourself? No  Getting from bed to chair? No  Climbing a flight of stairs? No  Preparing food and eating?: No  Bathing or showering? No  Getting dressed: No  Getting to the toilet? No  Using the toilet:No  Moving around from place to place: No  In the past year have you fallen or had a near fall?:No  Are you sexually active? No  Do you have more than one partner? No   Hearing Difficulties: yes- has bilateral hearing loss Do you often ask people to speak up or repeat themselves? No  Do you experience ringing or noises in your ears? No  Do you have difficulty understanding soft or whispered voices? No  Do you feel that you have a problem with memory? No Do you often misplace items? No    Home Safety:  Do you have a smoke alarm at your residence? Yes Do you have grab bars in the bathroom? no Do you have throw rugs in your house? no   Cognitive Testing  Alert? Yes Normal Appearance?Yes  Oriented to person? Yes Place? Yes  Time? Yes  Recall of three objects? Yes  Can perform simple calculations? Yes  Displays appropriate judgment?Yes  Can read the correct time from a watch face?Yes   List the Names of Other Physician/Practitioners you currently use:  See  referral list for the physicians patient is currently seeing.  Dr. Bing Plume for glaucoma   Review of Systems: see EPIC   Objective:     General appearance: Appears stated age. Head: Normocephalic, without obvious abnormality, atraumatic  Eyes: conj clear, EOMi PEERLA  Ears: normal TM's and external ear canals both ears  Nose: Nares normal. Septum midline. Mucosa normal. No drainage or sinus tenderness.  Throat: lips, mucosa, and tongue normal; teeth and gums normal  Neck: no adenopathy, no carotid bruit, no JVD, supple, symmetrical, trachea midline and thyroid not enlarged, symmetric, no tenderness/mass/nodules  No CVA tenderness.  Lungs: clear to auscultation bilaterally  Breasts: normal appearance, no masses or tenderness Heart: regular rate and rhythm, S1, S2 normal, no murmur, click, rub or gallop  Abdomen: soft, non-tender; bowel sounds normal; no masses, no organomegaly  Musculoskeletal: ROM normal in all joints, no crepitus, no deformity, Normal muscle strengthen. Back  is symmetric, no curvature. Skin: Skin color, texture, turgor normal. No rashes or lesions  Lymph nodes: Cervical, supraclavicular, and axillary nodes normal.  Neurologic: CN 2 -12 Normal, Normal symmetric reflexes. Normal  coordination and gait  Psych: Alert & Oriented x 3, Mood appear stable.    Assessment:    Annual wellness medicare exam   Plan:    During the course of the visit the patient was educated and counseled about appropriate screening and preventive services including:   Annual mammogram Order given for bone density Due for colonoscopy last 2004    Patient Instructions (the written plan) was given to the patient.  Medicare Attestation  I have personally reviewed:  The patient's medical and social history  Their use of alcohol, tobacco or illicit drugs  Their current medications and supplements  The patient's functional ability including ADLs,fall risks, home safety risks, cognitive,  and hearing and visual impairment  Diet and physical activities  Evidence for depression or mood disorders  The patient's weight, height, BMI, and visual acuity have been recorded in the chart. I have made referrals, counseling, and provided education to the patient based on review of the above and I have provided the patient with a written personalized care plan for preventive services.

## 2013-11-10 NOTE — Addendum Note (Signed)
Addended by: Brett Canales on: 11/10/2013 10:31 AM   Modules accepted: Orders

## 2014-02-22 ENCOUNTER — Encounter: Payer: Self-pay | Admitting: Internal Medicine

## 2014-02-22 ENCOUNTER — Ambulatory Visit (INDEPENDENT_AMBULATORY_CARE_PROVIDER_SITE_OTHER): Payer: Medicare Other | Admitting: Internal Medicine

## 2014-02-22 VITALS — BP 118/60 | Temp 98.9°F | Wt 147.0 lb

## 2014-02-22 DIAGNOSIS — L03119 Cellulitis of unspecified part of limb: Secondary | ICD-10-CM

## 2014-02-22 DIAGNOSIS — IMO0002 Reserved for concepts with insufficient information to code with codable children: Secondary | ICD-10-CM

## 2014-02-22 DIAGNOSIS — S40861A Insect bite (nonvenomous) of right upper arm, initial encounter: Secondary | ICD-10-CM

## 2014-02-22 DIAGNOSIS — W57XXXA Bitten or stung by nonvenomous insect and other nonvenomous arthropods, initial encounter: Principal | ICD-10-CM

## 2014-02-22 DIAGNOSIS — T148 Other injury of unspecified body region: Secondary | ICD-10-CM

## 2014-02-22 MED ORDER — CEPHALEXIN 500 MG PO CAPS: 500.0000 mg | ORAL_CAPSULE | Freq: Four times a day (QID) | ORAL | Status: AC

## 2014-02-22 MED ORDER — PREDNISONE 10 MG PO KIT: 10.0000 mg | PACK | Freq: Every morning | ORAL | Status: DC

## 2014-03-06 NOTE — Patient Instructions (Signed)
Take Keflex 4 times daily for 10 days. Takes 6 day prednisone dosepak as directed. Call if not better in 48 hours or sooner if worse

## 2014-03-06 NOTE — Progress Notes (Signed)
   Subjective:    Patient ID: Faith Miles, female    DOB: Sep 19, 1945, 68 y.o.   MRN: [id number]  HPI   Apparently, an insect bit her on her right upper arm. A bullous lesion formed over the bite with some surrounding erythema and she has become worried. It is quite itchy.    Review of Systems     Objective:   Physical Exam  quarter size bullous lesion over bite with surrounding erythema        Assessment & Plan:  Insect bite right arm with possible cellulitis  Plan: Keflex 500 mg 4 times daily for 10 days. Sterapred DS 10 mg 6 day dosepak take as directed.

## 2014-06-03 ENCOUNTER — Other Ambulatory Visit: Payer: Self-pay | Admitting: Internal Medicine

## 2014-06-03 DIAGNOSIS — I659 Occlusion and stenosis of unspecified precerebral artery: Secondary | ICD-10-CM

## 2014-06-03 DIAGNOSIS — E2839 Other primary ovarian failure: Secondary | ICD-10-CM

## 2014-06-22 ENCOUNTER — Ambulatory Visit
Admission: RE | Admit: 2014-06-22 | Discharge: 2014-06-22 | Disposition: A | Discharge status: Home / Self Care | Payer: Medicare Other | Source: Ambulatory Visit | Attending: Internal Medicine | Admitting: Internal Medicine

## 2014-06-22 ENCOUNTER — Telehealth: Payer: Self-pay

## 2014-06-22 DIAGNOSIS — E2839 Other primary ovarian failure: Secondary | ICD-10-CM

## 2014-06-22 NOTE — Telephone Encounter (Signed)
Patient aware of results.  Will continue medication.

## 2014-06-22 NOTE — Telephone Encounter (Signed)
Message copied by Amado Coe on Tue Jun 22, 2014  1:59 PM ------      Message from: Elby Showers      Created: Tue Jun 22, 2014 10:34 AM       Bone density shows mild osteopenia. Continue calcium and vitamin D. Repeat in 2-3 years. ------

## 2014-07-02 ENCOUNTER — Encounter: Payer: Self-pay | Admitting: Internal Medicine

## 2014-12-07 ENCOUNTER — Other Ambulatory Visit: Payer: Self-pay

## 2014-12-07 DIAGNOSIS — Z1231 Encounter for screening mammogram for malignant neoplasm of breast: Secondary | ICD-10-CM

## 2014-12-21 ENCOUNTER — Ambulatory Visit: Payer: Self-pay

## 2014-12-22 ENCOUNTER — Ambulatory Visit: Payer: Self-pay

## 2014-12-28 ENCOUNTER — Ambulatory Visit
Admission: RE | Admit: 2014-12-28 | Discharge: 2014-12-28 | Disposition: A | Discharge status: Home / Self Care | Payer: PPO | Source: Ambulatory Visit

## 2014-12-28 DIAGNOSIS — Z1231 Encounter for screening mammogram for malignant neoplasm of breast: Secondary | ICD-10-CM

## 2014-12-29 ENCOUNTER — Ambulatory Visit: Payer: Self-pay

## 2015-02-10 ENCOUNTER — Encounter: Payer: Self-pay | Admitting: Podiatry

## 2015-02-10 ENCOUNTER — Ambulatory Visit (INDEPENDENT_AMBULATORY_CARE_PROVIDER_SITE_OTHER): Payer: PPO

## 2015-02-10 ENCOUNTER — Ambulatory Visit (INDEPENDENT_AMBULATORY_CARE_PROVIDER_SITE_OTHER): Payer: PPO | Admitting: Podiatry

## 2015-02-10 VITALS — BP 128/75 | HR 65 | Resp 16

## 2015-02-10 DIAGNOSIS — M779 Enthesopathy, unspecified: Secondary | ICD-10-CM

## 2015-02-10 DIAGNOSIS — M7662 Achilles tendinitis, left leg: Secondary | ICD-10-CM | POA: Diagnosis not present

## 2015-02-10 DIAGNOSIS — M722 Plantar fascial fibromatosis: Secondary | ICD-10-CM

## 2015-02-10 MED ORDER — MELOXICAM 15 MG PO TABS: 15.0000 mg | ORAL_TABLET | Freq: Every day | ORAL | Status: DC

## 2015-02-10 MED ORDER — METHYLPREDNISOLONE 4 MG PO TBPK: ORAL_TABLET | ORAL | Status: DC

## 2015-02-10 NOTE — Progress Notes (Signed)
   Subjective:    Patient ID: Faith Miles, female    DOB: 1946-05-11, 69 y.o.   MRN: [id number]  HPI Comments: "I have pain in both feet"  Patient c/o aching dorsal midfoot and posterior heel left for few months. No injury or swelling. She notices it just being on her feet a lot. AM pain with heel. She has been icing and stretching. She thinks the arches have fallen.  Foot Pain      Review of Systems  All other systems reviewed and are negative.      Objective:   Physical Exam: I have reviewed her past medical history medications allergies surgery social history and review of systems area pulses are strongly palpable bilaterally. Neurologic sensorium is intact for Semmes-Weinstein monofilament. Deep tendon reflexes are intact bilateral and muscle strength +5 over 5 dorsiflexion plantar flexors and inverters and evertors onto the musculature is intact. Orthopedic evaluation demonstrates pain on palpation medial calcaneal tubercles bilateral. Redressed confirm soft tissue increasing density at the plantar fascial calcaneal insertion site left greater than right.        Assessment & Plan:  Assessment: Plantar fasciitis bilateral lateral compensatory syndrome bilateral. Left greater than right.  Plan: We discussed etiology pathology conservative versus surgical therapies. We discussed appropriate shoe gear stretching exercises ice therapy issue gear modifications. We discussed steroidal injection to the left heel which we performed today placed her on a Medrol Dosepak to be followed by meloxicam. Placed her in a plantar fascial brace as well as a night splint and we'll follow-up with her in 1 month.

## 2015-02-10 NOTE — Patient Instructions (Signed)

## 2015-03-03 ENCOUNTER — Ambulatory Visit (INDEPENDENT_AMBULATORY_CARE_PROVIDER_SITE_OTHER): Payer: PPO | Admitting: Podiatry

## 2015-03-03 ENCOUNTER — Encounter: Payer: Self-pay | Admitting: Podiatry

## 2015-03-03 VITALS — BP 101/62 | HR 81 | Resp 12

## 2015-03-03 DIAGNOSIS — M722 Plantar fascial fibromatosis: Secondary | ICD-10-CM | POA: Diagnosis not present

## 2015-03-03 NOTE — Progress Notes (Signed)
She presents today for follow-up of her plantar fasciitis she states that she is doing quite well with minimal discomfort.  Objective: Vital signs stable she is alert and oriented 3. No erythema or edema saline drainage or odor.  The patient me to calcaneal tubercle.  Assessment: Plantar fasciitis.  Plan: Continue all conservative therapies notify us with any regression.

## 2015-03-08 ENCOUNTER — Ambulatory Visit: Payer: PPO | Admitting: Podiatry

## 2015-05-09 ENCOUNTER — Telehealth: Payer: Self-pay | Admitting: *Deleted

## 2015-05-09 NOTE — Telephone Encounter (Addendum)
Pt called left name and phone number.  I called and the phone rang for 30 seconds, then disconnected.  I spoke with pt and she states she was seen this am.

## 2015-05-10 ENCOUNTER — Ambulatory Visit (INDEPENDENT_AMBULATORY_CARE_PROVIDER_SITE_OTHER): Payer: PPO | Admitting: Podiatry

## 2015-05-10 ENCOUNTER — Encounter: Payer: Self-pay | Admitting: Podiatry

## 2015-05-10 VITALS — BP 107/73 | HR 80 | Resp 16

## 2015-05-10 DIAGNOSIS — M722 Plantar fascial fibromatosis: Secondary | ICD-10-CM | POA: Diagnosis not present

## 2015-05-10 NOTE — Progress Notes (Signed)
She presents stating that she will be leaving for [place] this weekend. She states that I need this foot better. She says that it is better than it was but it is not well. She states that she continues to wear Birkenstocks and not closed tissues. She was  Taking meloxicam. She continues to use the night splint.    objective: Vital signs are stable she is alert and oriented 3. Pulses are strongly palpable. Neurologic sensorium is intact. She has pain on palpation medial calcaneal tubercle of her left heel. Note in lesions or wounds.  Assessment: chronic intractable plantar fasciitis left.  Plan: Discussed etiology pathology conservative or surgical therapies. Discussed appropriate shoe gear stretching exercises and ice therapy. reinjected her left heel today with Kenalog and local and aesthetic. Follow up with her in 1 month she will also continue the use of the meloxicam for which we renewed her prescription.

## 2015-05-12 ENCOUNTER — Telehealth: Payer: Self-pay | Admitting: Internal Medicine

## 2015-05-12 MED ORDER — ZOLPIDEM TARTRATE 10 MG PO TABS: 10.0000 mg | ORAL_TABLET | Freq: Every evening | ORAL | Status: AC | PRN

## 2015-05-12 MED ORDER — ALPRAZOLAM 0.5 MG PO TABS: 0.5000 mg | ORAL_TABLET | Freq: Two times a day (BID) | ORAL | Status: DC | PRN

## 2015-05-12 MED ORDER — CIPROFLOXACIN HCL 500 MG PO TABS: 500.0000 mg | ORAL_TABLET | Freq: Two times a day (BID) | ORAL | Status: DC

## 2015-05-12 NOTE — Telephone Encounter (Signed)
Please prescribe Cipro 500 mg #14 one po bid x 7 days Refill Ambien once Xanax 0.5 mg #30 one po bid prn

## 2015-05-12 NOTE — Telephone Encounter (Signed)
Rxs sent

## 2015-05-12 NOTE — Telephone Encounter (Signed)
She is going to be leaving for [place] on Sunday.  Said this "crept" up on her.  Wants to know if you will prescribe her Xanax for traveling on the plane to sleep.  She also asked for refill on her Ambien and a prescription for an antibiotic to take with her in the event that she should get sick while there.  She will be there until 9/20.    I did speak with her about her needing to have a PE with you.  It was due in March of this year.  Advised that we are now booking in October.  She wants to get a flu shot and address whether or not she needs an additional pnuemonia shot.  Said she will set that appointment up when she returns.     [facility] [address]

## 2015-05-25 ENCOUNTER — Telehealth: Payer: Self-pay | Admitting: *Deleted

## 2015-05-25 NOTE — Telephone Encounter (Signed)
Received prior authorization request for ambien from pharmacy . This medication was ordered for patient to use while traveling she picked up her xanax but did not want to pay out of pocket for ambien [facility]ist. Prior authorization will not be done at this time since this is not a daily medication.

## 2015-06-05 ENCOUNTER — Emergency Department (INDEPENDENT_AMBULATORY_CARE_PROVIDER_SITE_OTHER)
Admission: EM | Admit: 2015-06-05 | Discharge: 2015-06-05 | Disposition: A | Discharge status: Home / Self Care | Payer: PPO | Source: Home / Self Care | Attending: Emergency Medicine | Admitting: Emergency Medicine

## 2015-06-05 ENCOUNTER — Emergency Department (INDEPENDENT_AMBULATORY_CARE_PROVIDER_SITE_OTHER): Payer: PPO

## 2015-06-05 ENCOUNTER — Encounter (HOSPITAL_COMMUNITY): Payer: Self-pay | Admitting: *Deleted

## 2015-06-05 DIAGNOSIS — R6889 Other general symptoms and signs: Secondary | ICD-10-CM

## 2015-06-05 MED ORDER — OSELTAMIVIR PHOSPHATE 75 MG PO CAPS: 75.0000 mg | ORAL_CAPSULE | Freq: Two times a day (BID) | ORAL | Status: DC

## 2015-06-05 NOTE — Discharge Instructions (Signed)
You likely have the flu. Take Tamiflu twice a day for the next 5 days. Make sure you drink plenty of fluids and get plenty of rest. You can take Tylenol or ibuprofen as needed for fever or body aches. The flu typically lasts 7-10 days.

## 2015-06-05 NOTE — ED Notes (Signed)
Returned from [place] 2 days ago.  Started suddenly feeling feverish yesterday with "tickly in my throat, but not sore throat", then started with slightly productive cough "more from drainage", c/o body aches, temps up to 100.7 at home.  Has been taking Mucinex and Nyquil.

## 2015-06-05 NOTE — ED Provider Notes (Signed)
CSN: [id number]     Arrival date & time 06/05/15  1312 History   First MD Initiated Contact with Patient 06/05/15 1345     Chief Complaint  Patient presents with  . Cough  . Generalized Body Aches   (Consider location/radiation/quality/duration/timing/severity/associated sxs/prior Treatment) HPI  She is a 69 year old woman here for evaluation of cough and fever. Her symptoms started last night with a scratchy throat and generalized body aches. She reports a fever of 100.7 last night. Today, she describes some nasal congestion and rhinorrhea, sore throat, and cough. No chest pain or shortness of breath. She reports decreased appetite, but no nausea or vomiting. She just got back from a trip to [place].  Past Medical History  Diagnosis Date  . Glaucoma   . Insomnia   . Osteopenia   . History of Bell's palsy   . Arthritis   . Cataract     surgery   Past Surgical History  Procedure Laterality Date  . Tubal ligation    . Tonsillectomy    . Bunionectomy  12/01    bilateral  . Cataract extraction Bilateral 2010   Family History  Problem Relation Age of Onset  . Diabetes Mother   . Heart disease Mother   . Colon cancer Neg Hx    Social History  Substance Use Topics  . Smoking status: Former Smoker    Types: Cigarettes    Quit date: 03/19/2001  . Smokeless tobacco: Never Used  . Alcohol Use: Yes     Comment: occasional   OB History    No data available     Review of Systems As in history of present illness Allergies  Amoxicillin-pot clavulanate  Home Medications   Prior to Admission medications   Medication Sig Start Date End Date Taking? Authorizing Provider  cycloSPORINE (RESTASIS) 0.05 % ophthalmic emulsion Place 1 drop into both eyes 2 (two) times daily.   Yes Historical Provider, MD  meloxicam (MOBIC) 15 MG tablet Take 1 tablet (15 mg total) by mouth daily. 02/10/15  Yes Max T Hyatt, DPM  ALPRAZolam (XANAX) 0.5 MG tablet Take 1 tablet (0.5 mg total) by mouth 2  (two) times daily as needed for anxiety. 05/12/15   Elby Showers, MD  calcium carbonate (TUMS - DOSED IN MG ELEMENTAL CALCIUM) 500 MG chewable tablet Chew 1 tablet by mouth daily.    Historical Provider, MD  cholecalciferol (VITAMIN D) 1000 UNITS tablet Take 1,000 Units by mouth daily.    Historical Provider, MD  oseltamivir (TAMIFLU) 75 MG capsule Take 1 capsule (75 mg total) by mouth every 12 (twelve) hours. 06/05/15   Melony Overly, MD  vitamin C (ASCORBIC ACID) 500 MG tablet Take 500 mg by mouth daily.    Historical Provider, MD  zolpidem (AMBIEN) 10 MG tablet Take 1 tablet (10 mg total) by mouth at bedtime as needed for sleep. 05/12/15 06/11/15  Elby Showers, MD   Meds Ordered and Administered this Visit  Medications - No data to display  BP 143/93 mmHg  Pulse 92  Temp(Src) 99.3 F (37.4 C) (Oral)  Resp 18  SpO2 98% No data found.   Physical Exam  Constitutional: She is oriented to person, place, and time. She appears well-developed and well-nourished. No distress.  HENT:  Mouth/Throat: No oropharyngeal exudate.  Mild erythema posterior pharynx  Eyes: Conjunctivae are normal.  Neck: Neck supple.  Cardiovascular: Normal rate, regular rhythm and normal heart sounds.   No murmur heard. Pulmonary/Chest: Effort normal  and breath sounds normal. No respiratory distress. She has no wheezes. She has no rales.  Lymphadenopathy:    She has no cervical adenopathy.  Neurological: She is alert and oriented to person, place, and time.    ED Course  Procedures (including critical care time)  Labs Review Labs Reviewed - No data to display  Imaging Review Dg Chest 2 View  06/05/2015   CLINICAL DATA:  Cough, fever x2 days  EXAM: CHEST  2 VIEW  COMPARISON:  None.  FINDINGS: Lungs are clear.  No pleural effusion or pneumothorax.  The heart is normal in size.  Mild degenerative changes of the visualized thoracolumbar spine.  IMPRESSION: No evidence of acute cardiopulmonary disease.    Electronically Signed   By: Julian Hy M.D.   On: 06/05/2015 14:09     MDM   1. Flu-like symptoms    Treat with Tamiflu. Symptomatic care discussed. Follow-up as needed.    Melony Overly, MD 06/05/15 1426

## 2015-06-07 ENCOUNTER — Ambulatory Visit: Payer: PPO | Admitting: Podiatry

## 2015-08-11 ENCOUNTER — Other Ambulatory Visit: Payer: PPO | Admitting: Internal Medicine

## 2015-08-11 DIAGNOSIS — Z79899 Other long term (current) drug therapy: Secondary | ICD-10-CM

## 2015-08-11 DIAGNOSIS — E785 Hyperlipidemia, unspecified: Secondary | ICD-10-CM

## 2015-08-11 DIAGNOSIS — E559 Vitamin D deficiency, unspecified: Secondary | ICD-10-CM

## 2015-08-11 DIAGNOSIS — Z0000 Encounter for general adult medical examination without abnormal findings: Secondary | ICD-10-CM

## 2015-08-11 DIAGNOSIS — M8580 Other specified disorders of bone density and structure, unspecified site: Secondary | ICD-10-CM

## 2015-08-11 LAB — COMPLETE METABOLIC PANEL WITH GFR
ALT: 14 U/L (ref 6–29)
AST: 18 U/L (ref 10–35)
Albumin: 3.9 g/dL (ref 3.6–5.1)
Alkaline Phosphatase: 62 U/L (ref 33–130)
BILIRUBIN TOTAL: 1.2 mg/dL (ref 0.2–1.2)
BUN: 16 mg/dL (ref 7–25)
CO2: 24 mmol/L (ref 20–31)
CREATININE: 0.69 mg/dL (ref 0.60–0.93)
Calcium: 9.3 mg/dL (ref 8.6–10.4)
Chloride: 103 mmol/L (ref 98–110)
GFR, Est African American: >89 mL/min (ref >=60)
GFR, Est Non African American: 88 mL/min (ref >=60)
Glucose, Bld: 92 mg/dL (ref 65–99)
Potassium: 4.7 mmol/L (ref 3.5–5.3)
SODIUM: 137 mmol/L (ref 135–146)
TOTAL PROTEIN: 6.8 g/dL (ref 6.1–8.1)

## 2015-08-11 LAB — LIPID PANEL
Cholesterol: 206 mg/dL — ABNORMAL HIGH (ref 125–200)
HDL: 87 mg/dL (ref >=46)
LDL CALC: 106 mg/dL (ref <130)
Total CHOL/HDL Ratio: 2.4 Ratio (ref <=5.0)
Triglycerides: 67 mg/dL (ref <150)
VLDL: 13 mg/dL (ref <30)

## 2015-08-11 LAB — CBC WITH DIFFERENTIAL/PLATELET
BASOS ABS: 0.1 10*3/uL (ref 0.0–0.1)
Basophils Relative: 1 % (ref 0–1)
Eosinophils Absolute: 0.1 10*3/uL (ref 0.0–0.7)
Eosinophils Relative: 2 % (ref 0–5)
HEMATOCRIT: 40.8 % (ref 36.0–46.0)
HEMOGLOBIN: 14.1 g/dL (ref 12.0–15.0)
LYMPHS PCT: 35 % (ref 12–46)
Lymphs Abs: 2 10*3/uL (ref 0.7–4.0)
MCH: 31.5 pg (ref 26.0–34.0)
MCHC: 34.6 g/dL (ref 30.0–36.0)
MCV: 91.1 fL (ref 78.0–100.0)
MPV: 10.1 fL (ref 8.6–12.4)
Monocytes Absolute: 0.4 10*3/uL (ref 0.1–1.0)
Monocytes Relative: 7 % (ref 3–12)
NEUTROS ABS: 3.1 10*3/uL (ref 1.7–7.7)
NEUTROS PCT: 55 % (ref 43–77)
PLATELETS: 324 10*3/uL (ref 150–400)
RBC: 4.48 MIL/uL (ref 3.87–5.11)
RDW: 14.3 % (ref 11.5–15.5)
WBC: 5.6 10*3/uL (ref 4.0–10.5)

## 2015-08-11 LAB — TSH: TSH: 2.307 u[IU]/mL (ref 0.350–4.500)

## 2015-08-12 ENCOUNTER — Ambulatory Visit (INDEPENDENT_AMBULATORY_CARE_PROVIDER_SITE_OTHER): Payer: PPO | Admitting: Internal Medicine

## 2015-08-12 ENCOUNTER — Encounter: Payer: Self-pay | Admitting: Internal Medicine

## 2015-08-12 VITALS — BP 112/72 | HR 72 | Temp 98.0°F | Resp 20 | Ht 63.0 in | Wt 139.5 lb

## 2015-08-12 DIAGNOSIS — H9193 Unspecified hearing loss, bilateral: Secondary | ICD-10-CM

## 2015-08-12 DIAGNOSIS — Z23 Encounter for immunization: Secondary | ICD-10-CM

## 2015-08-12 DIAGNOSIS — F411 Generalized anxiety disorder: Secondary | ICD-10-CM | POA: Diagnosis not present

## 2015-08-12 DIAGNOSIS — Z0000 Encounter for general adult medical examination without abnormal findings: Secondary | ICD-10-CM | POA: Diagnosis not present

## 2015-08-12 DIAGNOSIS — H409 Unspecified glaucoma: Secondary | ICD-10-CM

## 2015-08-12 DIAGNOSIS — M791 Myalgia: Secondary | ICD-10-CM | POA: Diagnosis not present

## 2015-08-12 DIAGNOSIS — R319 Hematuria, unspecified: Secondary | ICD-10-CM | POA: Diagnosis not present

## 2015-08-12 DIAGNOSIS — Z85828 Personal history of other malignant neoplasm of skin: Secondary | ICD-10-CM | POA: Diagnosis not present

## 2015-08-12 DIAGNOSIS — M7918 Myalgia, other site: Secondary | ICD-10-CM

## 2015-08-12 LAB — POCT URINALYSIS DIPSTICK
Bilirubin, UA: NEGATIVE
Glucose, UA: NEGATIVE
KETONES UA: NEGATIVE
LEUKOCYTES UA: NEGATIVE
Nitrite, UA: NEGATIVE
PH UA: 7
PROTEIN UA: NEGATIVE
SPEC GRAV UA: 1.015
UROBILINOGEN UA: 0.2

## 2015-08-12 LAB — VITAMIN D 25 HYDROXY (VIT D DEFICIENCY, FRACTURES): Vit D, 25-Hydroxy: 28 ng/mL — ABNORMAL LOW (ref 30–100)

## 2015-08-12 MED ORDER — MELOXICAM 15 MG PO TABS: 15.0000 mg | ORAL_TABLET | Freq: Every day | ORAL | Status: DC

## 2015-08-13 LAB — URINALYSIS, MICROSCOPIC ONLY
Bacteria, UA: NONE SEEN [HPF]
Casts: NONE SEEN [LPF]
Crystals: NONE SEEN [HPF]
Squamous Epithelial / HPF: NONE SEEN [HPF]
WBC, UA: NONE SEEN WBC/HPF
Yeast: NONE SEEN [HPF]

## 2015-08-13 LAB — URINALYSIS, ROUTINE W REFLEX MICROSCOPIC
Bilirubin Urine: NEGATIVE
Glucose, UA: NEGATIVE
Ketones, ur: NEGATIVE
Leukocytes, UA: NEGATIVE
Nitrite: NEGATIVE
Protein, ur: NEGATIVE
Specific Gravity, Urine: 1.014 (ref 1.001–1.035)
pH: 6 (ref 5.0–8.0)

## 2015-08-14 LAB — CULTURE, URINE COMPREHENSIVE
Colony Count: NO GROWTH
Organism ID, Bacteria: NO GROWTH

## 2015-08-19 ENCOUNTER — Telehealth: Payer: Self-pay | Admitting: Internal Medicine

## 2015-08-19 NOTE — Telephone Encounter (Signed)
Left message for return call.

## 2015-08-19 NOTE — Telephone Encounter (Signed)
Patient has not been on this before.  She didn't have a previous Rx for it.  She is wanting to start on it now.

## 2015-08-19 NOTE — Telephone Encounter (Signed)
Patient is calling asking if you would call in a Rx for Retin A for her?  She failed to mention that when she was here recently for her skin.    [facility] [address] [facility]

## 2015-08-19 NOTE — Telephone Encounter (Signed)
Need to know strength of previous Rx if she will provide and whether it was a cream or a gel.

## 2015-08-23 MED ORDER — TRETINOIN 0.025 % EX CREA: TOPICAL_CREAM | Freq: Every day | CUTANEOUS | Status: DC

## 2015-08-23 NOTE — Telephone Encounter (Signed)
May not be covered by insurance since it is cosmetic. May have to pay out of pocket. Rx: Retin A Cream 0.25%    20 gram tube. Apply pea sized amount to face at night    no refill

## 2015-08-23 NOTE — Telephone Encounter (Signed)
Please see below.

## 2015-09-16 DIAGNOSIS — M170 Bilateral primary osteoarthritis of knee: Secondary | ICD-10-CM | POA: Diagnosis not present

## 2015-09-22 DIAGNOSIS — G8929 Other chronic pain: Secondary | ICD-10-CM | POA: Diagnosis not present

## 2015-09-22 DIAGNOSIS — M25512 Pain in left shoulder: Secondary | ICD-10-CM | POA: Diagnosis not present

## 2015-09-23 DIAGNOSIS — H26491 Other secondary cataract, right eye: Secondary | ICD-10-CM | POA: Diagnosis not present

## 2015-09-23 DIAGNOSIS — H40023 Open angle with borderline findings, high risk, bilateral: Secondary | ICD-10-CM | POA: Diagnosis not present

## 2015-09-23 DIAGNOSIS — H524 Presbyopia: Secondary | ICD-10-CM | POA: Diagnosis not present

## 2015-10-11 NOTE — Progress Notes (Signed)
Subjective:    Patient ID: Faith Miles, female    DOB: [birth year], 70 y.o.   MRN: [id number]  HPI 70 year old White Female in today for health maintenance exam and evaluation of medical issues. History of anxiety, osteopenia, osteoarthritis left knee. Wears hearing aids both ears due to bilateral hearing loss.  Past medical history: Tubal ligation 1982. Tonsillectomy in childhood. History of glaucoma. Left Bell's palsy August 2003. Surgery for bilateral bunions by podiatrist 2011. History of bruxism and TMJ syndrome.  Pneumovax 23 October 11, Zostavax vaccine November 2011. Tetanus immunization February 2011. Prevnar 13 given today  Squamous cell carcinoma right leg treated by dermatologist 2013.  History of basal cell carcinoma but I'm right ear and leg in February 2013. She is followed by Dr. Ronnald Ramp at [facility].  Social history: Drinks 1 or 2 ounces of wine or liquor daily. Occasionally smoked in the remote past. Has a four-year college degree. She is married. She had her husband both retired from [employer].  Family history: Mother died at age 71 with Alzheimer's disease. Father died at age 79 with diabetes and coronary disease. Both parents with history of hypertension. 3 sisters in good health. One daughter is bipolar. One son in good health. Colonoscopy done January 2015   Review of Systems  Constitutional: Negative.   HENT:       Bilateral hearing loss  Eyes:       History of glaucoma  Respiratory: Negative.   Cardiovascular: Negative.   Genitourinary: Negative.   Neurological: Negative.   Hematological: Negative.   Psychiatric/Behavioral:       History of anxiety       Objective:   Physical Exam  Constitutional: She is oriented to person, place, and time. She appears well-developed and well-nourished. No distress.  HENT:  Head: Atraumatic.  Right Ear: External ear normal.  Left Ear: External ear normal.  Mouth/Throat: Oropharynx is clear and  moist. No oropharyngeal exudate.  Eyes: Conjunctivae and EOM are normal. Pupils are equal, round, and reactive to light. Right eye exhibits no discharge. Left eye exhibits no discharge.  Neck: Neck supple. No JVD present.  Cardiovascular: Normal rate, regular rhythm, normal heart sounds and intact distal pulses.   No murmur heard. Pulmonary/Chest: Effort normal and breath sounds normal. No respiratory distress. She has no wheezes. She has no rales. She exhibits no tenderness.  Breasts normal female  Abdominal: Soft. Bowel sounds are normal. She exhibits no distension and no mass. There is no tenderness. There is no rebound and no guarding.  Genitourinary:  Pap taken 2015. Bimanual normal today  Musculoskeletal: Normal range of motion. She exhibits no edema.  Neurological: She is alert and oriented to person, place, and time. She has normal reflexes. No cranial nerve deficit. Coordination normal.  Skin: Skin is warm and dry. No rash noted. She is not diaphoretic.  Psychiatric: She has a normal mood and affect. Her behavior is normal. Judgment and thought content normal.  Vitals reviewed.         Assessment & Plan:  History of glaucoma  History of skin cancer  Bilateral hearing loss now wearing bilateral hearing aids  History of glaucoma followed by Dr. Bing Plume  History of anxiety  Plan: Prevnar 13 given. Return in one year or as needed  Urine dipstick showed trace occult blood but microscopic showed 0-2 red blood cells per high-powered field so we are not concerned about this  Subjective:   Patient presents for Medicare Annual/Subsequent preventive  examination.  Review Past Medical/Family/Social: See above   Risk Factors  Current exercise habits: Fairly active Dietary issues discussed: Low fat low carbohydrate  Cardiac risk factors: Family history in father Depression Screen  (Note: if answer to either of the following is "Yes", a more complete depression screening is  indicated)   Over the past two weeks, have you felt down, depressed or hopeless? No  Over the past two weeks, have you felt little interest or pleasure in doing things? No Have you lost interest or pleasure in daily life? No Do you often feel hopeless? No Do you cry easily over simple problems? No   Activities of Daily Living  In your present state of health, do you have any difficulty performing the following activities?:   Driving? No  Managing money? No  Feeding yourself? No  Getting from bed to chair? No  Climbing a flight of stairs? No  Preparing food and eating?: No  Bathing or showering? No  Getting dressed: No  Getting to the toilet? No  Using the toilet:No  Moving around from place to place: No  In the past year have you fallen or had a near fall?:No  Are you sexually active? No  Do you have more than one partner? No   Hearing Difficulties: yes-wears bilateral hearing aids Do you often ask people to speak up or repeat themselves? No  Do you experience ringing or noises in your ears? No  Do you have difficulty understanding soft or whispered voices? No  Do you feel that you have a problem with memory? No Do you often misplace items? No    Home Safety:  Do you have a smoke alarm at your residence? Yes Do you have grab bars in the bathroom? no Do you have throw rugs in your house? no   Cognitive Testing  Alert? Yes Normal Appearance?Yes  Oriented to person? Yes Place? Yes  Time? Yes  Recall of three objects? Yes  Can perform simple calculations? Yes  Displays appropriate judgment?Yes  Can read the correct time from a watch face?Yes   List the Names of Other Physician/Practitioners you currently use:  See referral list for the physicians patient is currently seeing.  Ophthalmologist   Review of Systems: See above   Objective:     General appearance: Appears stated age  Head: Normocephalic, without obvious abnormality, atraumatic  Eyes: conj clear,  EOMi PEERLA  Ears: normal TM's and external ear canals both ears  Nose: Nares normal. Septum midline. Mucosa normal. No drainage or sinus tenderness.  Throat: lips, mucosa, and tongue normal; teeth and gums normal  Neck: no adenopathy, no carotid bruit, no JVD, supple, symmetrical, trachea midline and thyroid not enlarged, symmetric, no tenderness/mass/nodules  No CVA tenderness.  Lungs: clear to auscultation bilaterally  Breasts: normal appearance, no masses or tenderness, top of the pacemaker on left upper chest. Incision well-healed. It is tender.  Heart: regular rate and rhythm, S1, S2 normal, no murmur, click, rub or gallop  Abdomen: soft, non-tender; bowel sounds normal; no masses, no organomegaly  Musculoskeletal: ROM normal in all joints, no crepitus, no deformity, Normal muscle strengthen. Back  is symmetric, no curvature. Skin: Skin color, texture, turgor normal. No rashes or lesions  Lymph nodes: Cervical, supraclavicular, and axillary nodes normal.  Neurologic: CN 2 -12 Normal, Normal symmetric reflexes. Normal coordination and gait  Psych: Alert & Oriented x 3, Mood appear stable.    Assessment:    Annual wellness medicare exam  Plan:    During the course of the visit the patient was educated and counseled about appropriate screening and preventive services including:   Annual mammogram   Patient Instructions (the written plan) was given to the patient.  Medicare Attestation  I have personally reviewed:  The patient's medical and social history  Their use of alcohol, tobacco or illicit drugs  Their current medications and supplements  The patient's functional ability including ADLs,fall risks, home safety risks, cognitive, and hearing and visual impairment  Diet and physical activities  Evidence for depression or mood disorders  The patient's weight, height, BMI, and visual acuity have been recorded in the chart. I have made referrals, counseling, and provided  education to the patient based on review of the above and I have provided the patient with a written personalized care plan for preventive services.

## 2015-10-11 NOTE — Patient Instructions (Addendum)
Was a pleasure to see you today. Prevnar 13 given. Return in one year or as needed. Continue Xanax, meloxicam, vitamin D

## 2015-10-27 DIAGNOSIS — H26492 Other secondary cataract, left eye: Secondary | ICD-10-CM | POA: Diagnosis not present

## 2015-10-27 DIAGNOSIS — H43821 Vitreomacular adhesion, right eye: Secondary | ICD-10-CM | POA: Diagnosis not present

## 2015-10-27 DIAGNOSIS — H40023 Open angle with borderline findings, high risk, bilateral: Secondary | ICD-10-CM | POA: Diagnosis not present

## 2015-10-28 DIAGNOSIS — H40023 Open angle with borderline findings, high risk, bilateral: Secondary | ICD-10-CM | POA: Diagnosis not present

## 2015-10-28 DIAGNOSIS — H43813 Vitreous degeneration, bilateral: Secondary | ICD-10-CM | POA: Diagnosis not present

## 2015-10-28 DIAGNOSIS — H43392 Other vitreous opacities, left eye: Secondary | ICD-10-CM | POA: Diagnosis not present

## 2015-11-11 ENCOUNTER — Telehealth: Payer: Self-pay

## 2015-11-11 ENCOUNTER — Ambulatory Visit: Payer: PPO | Admitting: Internal Medicine

## 2015-11-11 NOTE — Telephone Encounter (Signed)
Patient states that she thinks she has the flu- has not been diagnosed. She states that she is running temp of 100.7. She denies any vomiting. She states that she doesn't have an appetite but is able to drink fluids. She is not short of breath and does not want to be seen. She was advised if she began vomiting and was unable to keep fluids done or if she developed any difficulty breathing with her coughing over the weekend to seek medical care at the urgent care. She states that she doesn't want to contaminate everyone else so she will just wait it out.

## 2015-11-22 ENCOUNTER — Ambulatory Visit: Payer: PPO | Admitting: Internal Medicine

## 2015-12-26 ENCOUNTER — Other Ambulatory Visit: Payer: Self-pay

## 2015-12-26 DIAGNOSIS — Z1231 Encounter for screening mammogram for malignant neoplasm of breast: Secondary | ICD-10-CM

## 2016-01-05 DIAGNOSIS — H524 Presbyopia: Secondary | ICD-10-CM | POA: Diagnosis not present

## 2016-01-05 DIAGNOSIS — H43821 Vitreomacular adhesion, right eye: Secondary | ICD-10-CM | POA: Diagnosis not present

## 2016-01-05 DIAGNOSIS — H5315 Visual distortions of shape and size: Secondary | ICD-10-CM | POA: Diagnosis not present

## 2016-01-05 DIAGNOSIS — H43391 Other vitreous opacities, right eye: Secondary | ICD-10-CM | POA: Diagnosis not present

## 2016-01-05 DIAGNOSIS — H40023 Open angle with borderline findings, high risk, bilateral: Secondary | ICD-10-CM | POA: Diagnosis not present

## 2016-01-09 ENCOUNTER — Ambulatory Visit
Admission: RE | Admit: 2016-01-09 | Discharge: 2016-01-09 | Disposition: A | Discharge status: Home / Self Care | Payer: PPO | Source: Ambulatory Visit

## 2016-01-09 DIAGNOSIS — Z1231 Encounter for screening mammogram for malignant neoplasm of breast: Secondary | ICD-10-CM

## 2016-04-12 DIAGNOSIS — H00014 Hordeolum externum left upper eyelid: Secondary | ICD-10-CM | POA: Diagnosis not present

## 2016-05-02 DIAGNOSIS — H04123 Dry eye syndrome of bilateral lacrimal glands: Secondary | ICD-10-CM | POA: Diagnosis not present

## 2016-05-02 DIAGNOSIS — H40023 Open angle with borderline findings, high risk, bilateral: Secondary | ICD-10-CM | POA: Diagnosis not present

## 2016-05-02 DIAGNOSIS — H5315 Visual distortions of shape and size: Secondary | ICD-10-CM | POA: Diagnosis not present

## 2016-05-02 DIAGNOSIS — H524 Presbyopia: Secondary | ICD-10-CM | POA: Diagnosis not present

## 2016-05-02 DIAGNOSIS — H43391 Other vitreous opacities, right eye: Secondary | ICD-10-CM | POA: Diagnosis not present

## 2016-05-02 DIAGNOSIS — H0011 Chalazion right upper eyelid: Secondary | ICD-10-CM | POA: Diagnosis not present

## 2016-05-04 DIAGNOSIS — S60562A Insect bite (nonvenomous) of left hand, initial encounter: Secondary | ICD-10-CM | POA: Diagnosis not present

## 2016-05-04 DIAGNOSIS — M79642 Pain in left hand: Secondary | ICD-10-CM | POA: Diagnosis not present

## 2016-05-04 DIAGNOSIS — W57XXXA Bitten or stung by nonvenomous insect and other nonvenomous arthropods, initial encounter: Secondary | ICD-10-CM | POA: Diagnosis not present

## 2016-05-08 ENCOUNTER — Encounter: Payer: Self-pay | Admitting: Internal Medicine

## 2016-05-08 ENCOUNTER — Ambulatory Visit (INDEPENDENT_AMBULATORY_CARE_PROVIDER_SITE_OTHER): Payer: PPO | Admitting: Internal Medicine

## 2016-05-08 VITALS — BP 114/72 | HR 82 | Temp 97.9°F | Ht 63.0 in | Wt 145.5 lb

## 2016-05-08 DIAGNOSIS — T63891D Toxic effect of contact with other venomous animals, accidental (unintentional), subsequent encounter: Secondary | ICD-10-CM

## 2016-05-08 DIAGNOSIS — T63481D Toxic effect of venom of other arthropod, accidental (unintentional), subsequent encounter: Secondary | ICD-10-CM

## 2016-05-08 MED ORDER — TRIAMCINOLONE ACETONIDE 0.1 % EX CREA: 1.0000 "application " | TOPICAL_CREAM | Freq: Three times a day (TID) | CUTANEOUS | 0 refills | Status: DC

## 2016-05-08 NOTE — Progress Notes (Signed)
   Subjective:    Patient ID: Faith Miles, female    DOB: 05-16-1946, 70 y.o.   MRN: [id number]  HPI    Review of Systems     Objective:   Physical Exam        Assessment & Plan:

## 2016-05-08 NOTE — Patient Instructions (Signed)
Apply triamcinolone cream 0.1% in 16 3 times daily for 7-10 days. Take Zyrtec at bedtime for 7-10 days for itching.

## 2016-05-09 NOTE — Progress Notes (Signed)
   Subjective:    Patient ID: Faith Miles, female    DOB: 1946-08-14, 70 y.o.   MRN: [id number]  HPI Patient was seen at [facility] August 25th with a three-day history of insect left hand. This apparently happened while she was doing yard workOn August 22. An insect flew by her and stung her on the left hand first MCP joint area. It had immediate swelling redness and firmness. She was given an injection of dexamethasone. She was already on a doxycycline preparation per Dr. Bing Plume for a stye that has been protracted. She is here today for follow-up. She feels like it's not getting better and is very itchy.    Review of Systems she had no shortness of breath or wheezing with the insect sting. No prior history of insect sting allergy.      Objective:   Physical Exam Minimal swelling and redness left first metacarpal area where insect stung her. It is not warm to touch. No evidence of secondary infection.       Assessment & Plan:  Insect sting left hand  Protracted hordeolum treated by Dr. Bing Plume  Plan: Triamcinolone cream to insect sting area 3 times daily for the next 7-10 days. She will continue with doxycycline per Dr. Bing Plume for eye condition which will also treat any secondary infection she may of had with this sting.Takes Zyrtec 5 mg at bedtime for 7-10 days. This will help with swelling.  She also has some question about some vitamins Dr. Bing Plume prescribed her for dry eye. It caused some nausea. It is likely due to high-dose B-6 in these vitamins. She should take the vitamins with a full meal.  She has a question about whether or not she needs an EpiPen for future staying. I do not think She needs EpiPen at the present time

## 2016-06-14 DIAGNOSIS — H04123 Dry eye syndrome of bilateral lacrimal glands: Secondary | ICD-10-CM | POA: Diagnosis not present

## 2016-06-14 DIAGNOSIS — H01004 Unspecified blepharitis left upper eyelid: Secondary | ICD-10-CM | POA: Diagnosis not present

## 2016-06-14 DIAGNOSIS — H01005 Unspecified blepharitis left lower eyelid: Secondary | ICD-10-CM | POA: Diagnosis not present

## 2016-06-14 DIAGNOSIS — H40023 Open angle with borderline findings, high risk, bilateral: Secondary | ICD-10-CM | POA: Diagnosis not present

## 2016-06-21 DIAGNOSIS — L812 Freckles: Secondary | ICD-10-CM | POA: Diagnosis not present

## 2016-06-21 DIAGNOSIS — Z85828 Personal history of other malignant neoplasm of skin: Secondary | ICD-10-CM | POA: Diagnosis not present

## 2016-06-21 DIAGNOSIS — D225 Melanocytic nevi of trunk: Secondary | ICD-10-CM | POA: Diagnosis not present

## 2016-06-21 DIAGNOSIS — L718 Other rosacea: Secondary | ICD-10-CM | POA: Diagnosis not present

## 2016-06-21 DIAGNOSIS — L570 Actinic keratosis: Secondary | ICD-10-CM | POA: Diagnosis not present

## 2016-06-21 DIAGNOSIS — L821 Other seborrheic keratosis: Secondary | ICD-10-CM | POA: Diagnosis not present

## 2016-07-14 ENCOUNTER — Other Ambulatory Visit: Payer: Self-pay | Admitting: Internal Medicine

## 2016-08-11 IMAGING — DX DG CHEST 2V
2 series · 2 of 2 positions shown · non-contrast
Comparison: None.

CLINICAL DATA: Cough, fever x2 days

EXAM:
CHEST  2 VIEW

[chest pa]
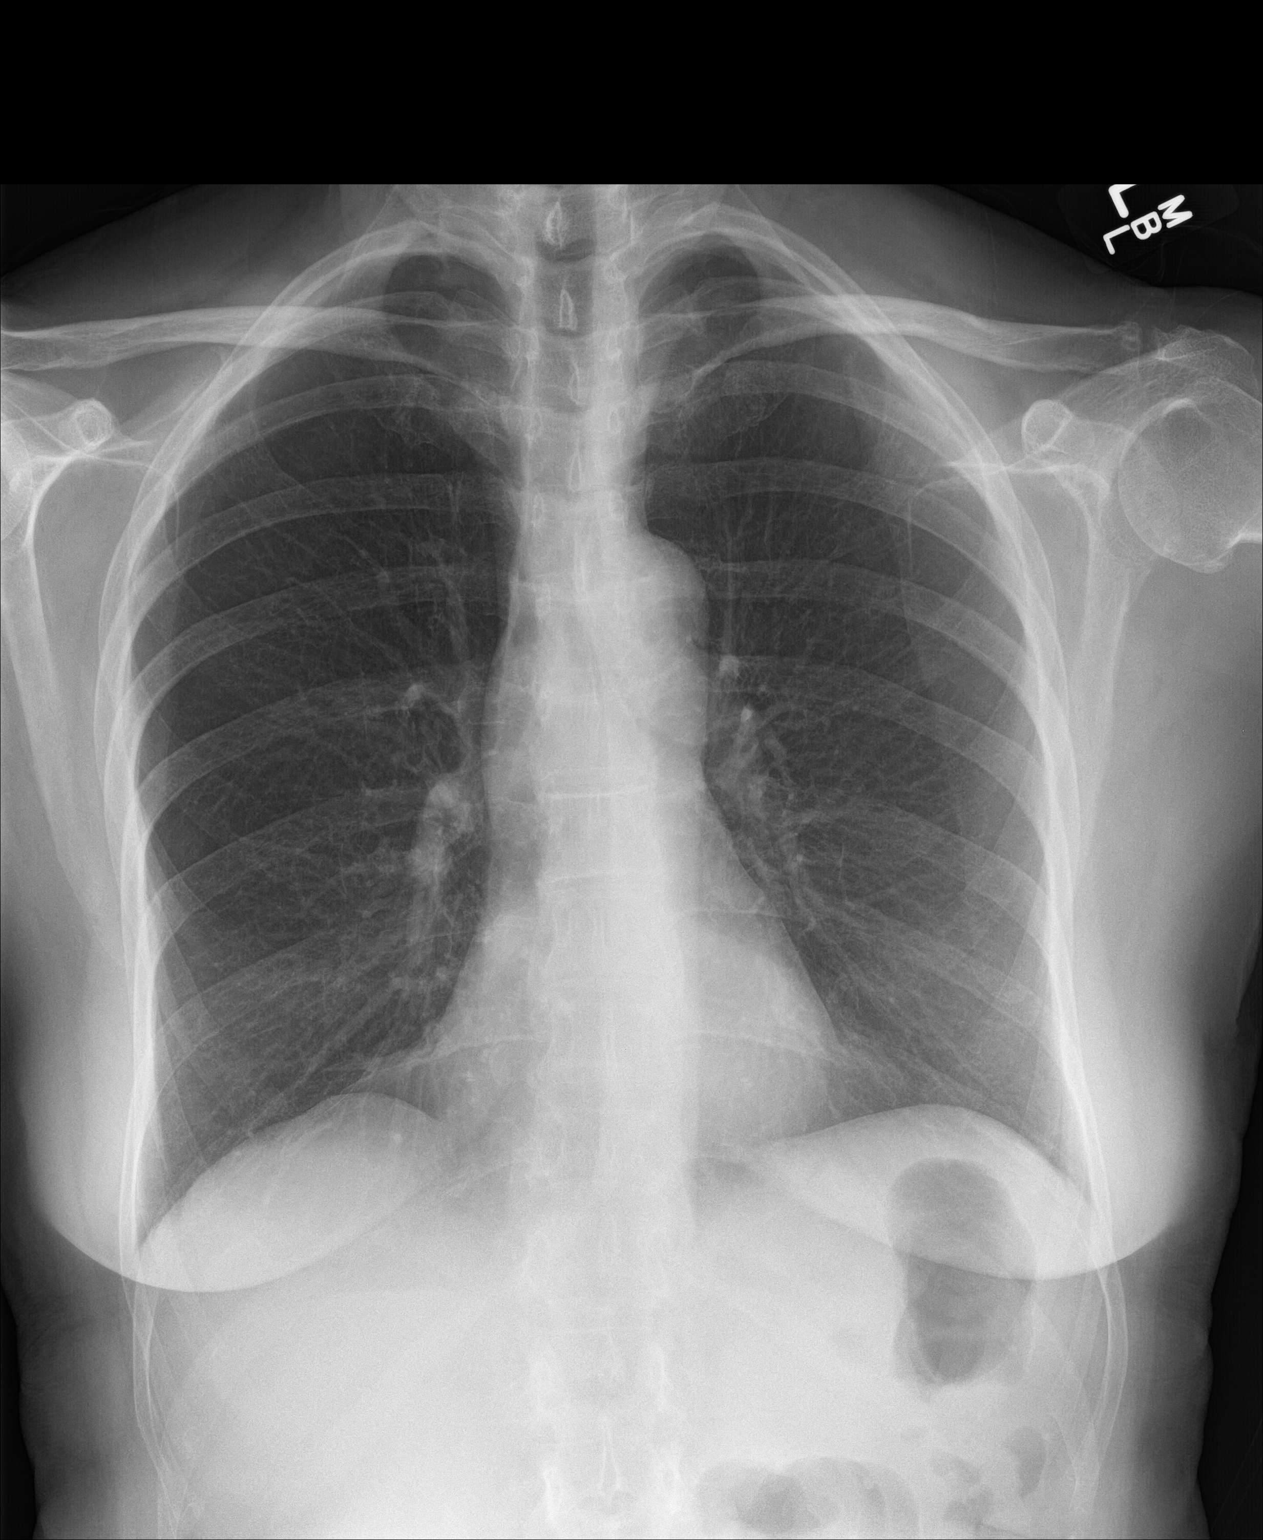

[chest lat]
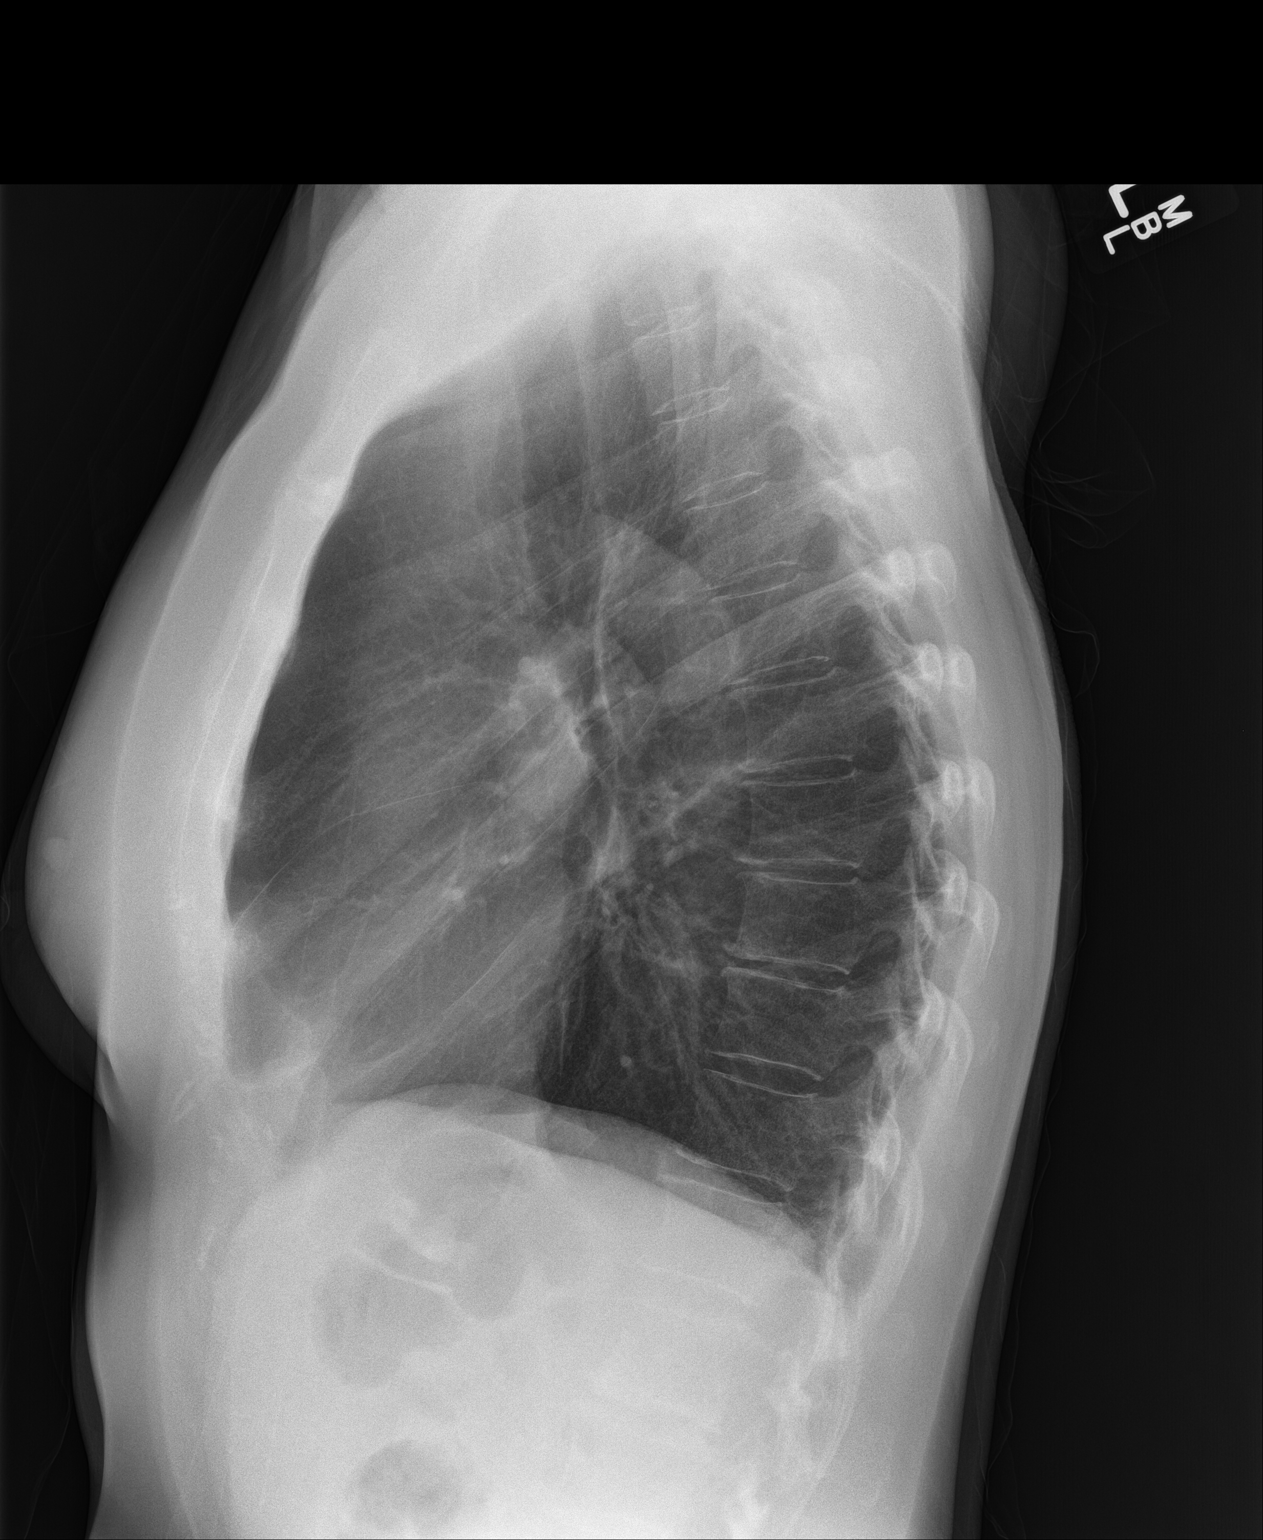

[2 of 2 positions shown; findings below may reference images not displayed]

FINDINGS: Lungs are clear.  No pleural effusion or pneumothorax.

The heart is normal in size.

Mild degenerative changes of the visualized thoracolumbar spine.
IMPRESSION: No evidence of acute cardiopulmonary disease.

## 2016-08-13 ENCOUNTER — Other Ambulatory Visit: Payer: PPO | Admitting: Internal Medicine

## 2016-08-13 DIAGNOSIS — Z0000 Encounter for general adult medical examination without abnormal findings: Secondary | ICD-10-CM

## 2016-08-13 DIAGNOSIS — E559 Vitamin D deficiency, unspecified: Secondary | ICD-10-CM | POA: Diagnosis not present

## 2016-08-13 DIAGNOSIS — E785 Hyperlipidemia, unspecified: Secondary | ICD-10-CM

## 2016-08-13 LAB — CBC WITH DIFFERENTIAL/PLATELET
BASOS ABS: 49 {cells}/uL (ref 0–200)
BASOS PCT: 1 %
EOS ABS: 196 {cells}/uL (ref 15–500)
Eosinophils Relative: 4 %
HEMATOCRIT: 42.7 % (ref 35.0–45.0)
Hemoglobin: 14.2 g/dL (ref 11.7–15.5)
Lymphocytes Relative: 32 %
Lymphs Abs: 1568 cells/uL (ref 850–3900)
MCH: 31.3 pg (ref 27.0–33.0)
MCHC: 33.3 g/dL (ref 32.0–36.0)
MCV: 94.1 fL (ref 80.0–100.0)
MPV: 10.2 fL (ref 7.5–12.5)
Monocytes Absolute: 343 cells/uL (ref 200–950)
Monocytes Relative: 7 %
NEUTROS ABS: 2744 {cells}/uL (ref 1500–7800)
Neutrophils Relative %: 56 %
Platelets: 311 10*3/uL (ref 140–400)
RBC: 4.54 MIL/uL (ref 3.80–5.10)
RDW: 13.9 % (ref 11.0–15.0)
WBC: 4.9 10*3/uL (ref 3.8–10.8)

## 2016-08-13 LAB — COMPLETE METABOLIC PANEL WITH GFR
ALT: 16 U/L (ref 6–29)
AST: 18 U/L (ref 10–35)
Albumin: 4.2 g/dL (ref 3.6–5.1)
Alkaline Phosphatase: 61 U/L (ref 33–130)
BILIRUBIN TOTAL: 1 mg/dL (ref 0.2–1.2)
BUN: 11 mg/dL (ref 7–25)
CALCIUM: 9.7 mg/dL (ref 8.6–10.4)
CO2: 28 mmol/L (ref 20–31)
Chloride: 105 mmol/L (ref 98–110)
Creat: 0.68 mg/dL (ref 0.60–0.93)
GFR, Est Non African American: 88 mL/min (ref >=60)
Glucose, Bld: 97 mg/dL (ref 65–99)
Potassium: 4.9 mmol/L (ref 3.5–5.3)
Sodium: 140 mmol/L (ref 135–146)
TOTAL PROTEIN: 6.9 g/dL (ref 6.1–8.1)

## 2016-08-13 LAB — LIPID PANEL
CHOLESTEROL: 213 mg/dL — AB (ref <200)
HDL: 92 mg/dL (ref >50)
LDL Cholesterol: 103 mg/dL — ABNORMAL HIGH (ref <100)
Total CHOL/HDL Ratio: 2.3 Ratio (ref <5.0)
Triglycerides: 90 mg/dL (ref <150)
VLDL: 18 mg/dL (ref <30)

## 2016-08-13 LAB — TSH: TSH: 1.67 m[IU]/L

## 2016-08-14 ENCOUNTER — Encounter: Payer: Self-pay | Admitting: Internal Medicine

## 2016-08-14 ENCOUNTER — Ambulatory Visit (INDEPENDENT_AMBULATORY_CARE_PROVIDER_SITE_OTHER): Payer: PPO | Admitting: Internal Medicine

## 2016-08-14 VITALS — BP 120/76 | HR 77 | Temp 99.3°F | Ht 63.0 in | Wt 146.0 lb

## 2016-08-14 DIAGNOSIS — Z0000 Encounter for general adult medical examination without abnormal findings: Secondary | ICD-10-CM | POA: Diagnosis not present

## 2016-08-14 DIAGNOSIS — H903 Sensorineural hearing loss, bilateral: Secondary | ICD-10-CM | POA: Diagnosis not present

## 2016-08-14 DIAGNOSIS — Z85828 Personal history of other malignant neoplasm of skin: Secondary | ICD-10-CM

## 2016-08-14 DIAGNOSIS — F411 Generalized anxiety disorder: Secondary | ICD-10-CM | POA: Diagnosis not present

## 2016-08-14 DIAGNOSIS — M7918 Myalgia, other site: Secondary | ICD-10-CM

## 2016-08-14 DIAGNOSIS — M791 Myalgia: Secondary | ICD-10-CM

## 2016-08-14 LAB — POCT URINALYSIS DIPSTICK
BILIRUBIN UA: NEGATIVE
GLUCOSE UA: NEGATIVE
KETONES UA: NEGATIVE
LEUKOCYTES UA: NEGATIVE
NITRITE UA: NEGATIVE
Protein, UA: NEGATIVE
Spec Grav, UA: <=1.005
Urobilinogen, UA: NEGATIVE
pH, UA: 6

## 2016-08-14 LAB — VITAMIN D 25 HYDROXY (VIT D DEFICIENCY, FRACTURES): Vit D, 25-Hydroxy: 33 ng/mL (ref 30–100)

## 2016-09-04 DIAGNOSIS — M25561 Pain in right knee: Secondary | ICD-10-CM | POA: Diagnosis not present

## 2016-09-04 DIAGNOSIS — M25531 Pain in right wrist: Secondary | ICD-10-CM | POA: Diagnosis not present

## 2016-09-09 NOTE — Progress Notes (Signed)
Subjective:    Patient ID: Faith Miles, female    DOB: 11-14-1945, 70 y.o.   MRN: [id number]  HPI 70 year old Female in today for health maintenance exam and evaluation of medical issues.  History of anxiety, osteopenia, osteoarthritis left knee. Wears hearing aids both ears due to bilateral hearing loss.  Past medical history: Tubal ligation 1982. Tonsillectomy in childhood. History of glaucoma. Left Bell's palsy August 2003. Surgery for bilateral bunions by podiatrist 2011. History of bruxism and TMJ syndrome.  Immunizations are up-to-date.  Squamous cell carcinoma right leg treated by dermatologist 2013.  History of basal cell carcinoma behind right ear and leg February 2013. She is followed by Dr. Ronnald Ramp at [facility].  Social history: Drinks 1 or 2 ounces of wine or liquor daily. Occasionally smoked in the remote past. Has a for your college degree. She is married. She and her husband both retired from [employer]. They have enjoyed traveling extensively.  Family history: Mother died at age 3 with Alzheimer's disease. Father died at age 76 with diabetes and coronary disease. Both parents with history of hypertension. 3 sisters in good health. One daughter is bipolar. One son in good health.  Patient had colonoscopy January 2015    Review of Systems  Constitutional: Negative.   All other systems reviewed and are negative.  no new complaints     Objective:   Physical Exam  Constitutional: She is oriented to person, place, and time. She appears well-developed and well-nourished. No distress.  HENT:  Head: Normocephalic and atraumatic.  Right Ear: External ear normal.  Left Ear: External ear normal.  Mouth/Throat: Oropharynx is clear and moist. No oropharyngeal exudate.  Eyes: Conjunctivae and EOM are normal. Pupils are equal, round, and reactive to light. Right eye exhibits no discharge. Left eye exhibits no discharge.  Neck: Neck supple. No JVD present. No  thyromegaly present.  Cardiovascular: Normal rate, regular rhythm and normal heart sounds.   No murmur heard. Pulmonary/Chest: Effort normal and breath sounds normal. She has no wheezes.  Genitourinary:  Genitourinary Comments: Pap taken 2015 and will not be repeated due to age. Bimanual normal  Musculoskeletal: Normal range of motion. She exhibits no edema.  Neurological: She is alert and oriented to person, place, and time. She has normal reflexes. No cranial nerve deficit. Coordination normal.  Skin: Skin is warm and dry. No rash noted. She is not diaphoretic.  Psychiatric: She has a normal mood and affect. Her behavior is normal. Judgment and thought content normal.  Vitals reviewed.         Assessment & Plan:  History of anxiety  Bilateral hearing loss  Glaucoma  History of skin cancer progress musculoskeletal pain  Plan: Continue same medications return in one year or as needed.  Subjective:   Patient presents for Medicare Annual/Subsequent preventive examination.  Review Past Medical/Family/Social:See above   Risk Factors  Current exercise habits: Is fairly active. Tries get regular exercise. Dietary issues discussed: Low fat low carbohydrate  Cardiac risk factors:Family history in father  Depression Screen  (Note: if answer to either of the following is "Yes", a more complete depression screening is indicated)   Over the past two weeks, have you felt down, depressed or hopeless? No  Over the past two weeks, have you felt little interest or pleasure in doing things? No Have you lost interest or pleasure in daily life? No Do you often feel hopeless? No Do you cry easily over simple problems? No  Activities of Daily Living  In your present state of health, do you have any difficulty performing the following activities?:   Driving? No  Managing money? No  Feeding yourself? No  Getting from bed to chair? No  Climbing a flight of stairs? No  Preparing food  and eating?: No  Bathing or showering? No  Getting dressed: No  Getting to the toilet? No  Using the toilet:No  Moving around from place to place: No  In the past year have you fallen or had a near fall?:No  Are you sexually active? No  Do you have more than one partner? No   Hearing Difficulties: No  Do you often ask people to speak up or repeat themselves? Yes due to bilateral hearing loss Do you experience ringing or noises in your ears? No  Do you have difficulty understanding soft or whispered voices? Yes Do you feel that you have a problem with memory? Somewhat Do you often misplace items? No    Home Safety:  Do you have a smoke alarm at your residence? Yes Do you have grab bars in the bathroom? No Do you have throw rugs in your house? Yes   Cognitive Testing  Alert? Yes Normal Appearance?Yes  Oriented to person? Yes Place? Yes  Time? Yes  Recall of three objects? Yes  Can perform simple calculations? Yes  Displays appropriate judgment?Yes  Can read the correct time from a watch face?Yes   List the Names of Other Physician/Practitioners you currently use:  See referral list for the physicians patient is currently seeing.     Review of Systems: See above   Objective:     General appearance: Appears stated age and thin Head: Normocephalic, without obvious abnormality, atraumatic  Eyes: conj clear, EOMi PEERLA  Ears: normal TM's and external ear canals both ears . Wears hearing aids bilaterally. Nose: Nares normal. Septum midline. Mucosa normal. No drainage or sinus tenderness.  Throat: lips, mucosa, and tongue normal; teeth and gums normal  Neck: no adenopathy, no carotid bruit, no JVD, supple, symmetrical, trachea midline and thyroid not enlarged, symmetric, no tenderness/mass/nodules  No CVA tenderness.  Lungs: clear to auscultation bilaterally  Breasts: normal appearance, no masses or tenderness, t Heart: regular rate and rhythm, S1, S2 normal, no murmur,  click, rub or gallop  Abdomen: soft, non-tender; bowel sounds normal; no masses, no organomegaly  Musculoskeletal: ROM normal in all joints, no crepitus, no deformity, Normal muscle strengthen. Back  is symmetric, no curvature. Skin: Skin color, texture, turgor normal. No rashes or lesions  Lymph nodes: Cervical, supraclavicular, and axillary nodes normal.  Neurologic: CN 2 -12 Normal, Normal symmetric reflexes. Normal coordination and gait  Psych: Alert & Oriented x 3, Mood appear stable.    Assessment:    Annual wellness medicare exam   Plan:    During the course of the visit the patient was educated and counseled about appropriate screening and preventive services including:   Annual mammogram  Annual flu vaccine     Patient Instructions (the written plan) was given to the patient.  Medicare Attestation  I have personally reviewed:  The patient's medical and social history  Their use of alcohol, tobacco or illicit drugs  Their current medications and supplements  The patient's functional ability including ADLs,fall risks, home safety risks, cognitive, and hearing and visual impairment  Diet and physical activities  Evidence for depression or mood disorders  The patient's weight, height, BMI, and visual acuity have been recorded in  the chart. I have made referrals, counseling, and provided education to the patient based on review of the above and I have provided the patient with a written personalized care plan for preventive services.

## 2016-09-09 NOTE — Patient Instructions (Signed)
It was a pleasure to see you today. Continue same medications and return in one year or as needed. 

## 2016-11-23 DIAGNOSIS — H00012 Hordeolum externum right lower eyelid: Secondary | ICD-10-CM | POA: Diagnosis not present

## 2016-11-29 DIAGNOSIS — H00012 Hordeolum externum right lower eyelid: Secondary | ICD-10-CM | POA: Diagnosis not present

## 2016-11-29 DIAGNOSIS — H00025 Hordeolum internum left lower eyelid: Secondary | ICD-10-CM | POA: Diagnosis not present

## 2016-12-11 ENCOUNTER — Other Ambulatory Visit: Payer: Self-pay | Admitting: Internal Medicine

## 2016-12-11 DIAGNOSIS — Z1231 Encounter for screening mammogram for malignant neoplasm of breast: Secondary | ICD-10-CM

## 2016-12-13 DIAGNOSIS — H40023 Open angle with borderline findings, high risk, bilateral: Secondary | ICD-10-CM | POA: Diagnosis not present

## 2016-12-13 DIAGNOSIS — H01001 Unspecified blepharitis right upper eyelid: Secondary | ICD-10-CM | POA: Diagnosis not present

## 2016-12-13 DIAGNOSIS — H04123 Dry eye syndrome of bilateral lacrimal glands: Secondary | ICD-10-CM | POA: Diagnosis not present

## 2016-12-13 DIAGNOSIS — H01002 Unspecified blepharitis right lower eyelid: Secondary | ICD-10-CM | POA: Diagnosis not present

## 2016-12-20 DIAGNOSIS — M170 Bilateral primary osteoarthritis of knee: Secondary | ICD-10-CM | POA: Diagnosis not present

## 2016-12-21 DIAGNOSIS — L84 Corns and callosities: Secondary | ICD-10-CM | POA: Diagnosis not present

## 2016-12-21 DIAGNOSIS — L578 Other skin changes due to chronic exposure to nonionizing radiation: Secondary | ICD-10-CM | POA: Diagnosis not present

## 2016-12-21 DIAGNOSIS — Z419 Encounter for procedure for purposes other than remedying health state, unspecified: Secondary | ICD-10-CM | POA: Diagnosis not present

## 2016-12-21 DIAGNOSIS — L821 Other seborrheic keratosis: Secondary | ICD-10-CM | POA: Diagnosis not present

## 2016-12-21 DIAGNOSIS — Z85828 Personal history of other malignant neoplasm of skin: Secondary | ICD-10-CM | POA: Diagnosis not present

## 2017-02-05 ENCOUNTER — Ambulatory Visit
Admission: RE | Admit: 2017-02-05 | Discharge: 2017-02-05 | Disposition: A | Discharge status: Home / Self Care | Payer: PPO | Source: Ambulatory Visit | Attending: Internal Medicine | Admitting: Internal Medicine

## 2017-02-05 DIAGNOSIS — Z1231 Encounter for screening mammogram for malignant neoplasm of breast: Secondary | ICD-10-CM | POA: Diagnosis not present

## 2017-02-07 ENCOUNTER — Ambulatory Visit: Payer: PPO

## 2017-06-03 ENCOUNTER — Ambulatory Visit (INDEPENDENT_AMBULATORY_CARE_PROVIDER_SITE_OTHER): Payer: PPO | Admitting: Internal Medicine

## 2017-06-03 ENCOUNTER — Encounter: Payer: Self-pay | Admitting: Internal Medicine

## 2017-06-03 VITALS — BP 100/60 | HR 74 | Temp 99.1°F | Wt 144.0 lb

## 2017-06-03 DIAGNOSIS — R5383 Other fatigue: Secondary | ICD-10-CM

## 2017-06-03 DIAGNOSIS — F321 Major depressive disorder, single episode, moderate: Secondary | ICD-10-CM

## 2017-06-03 DIAGNOSIS — Z23 Encounter for immunization: Secondary | ICD-10-CM | POA: Diagnosis not present

## 2017-06-03 LAB — TSH: TSH: 1.64 m[IU]/L (ref 0.40–4.50)

## 2017-06-03 LAB — T4, FREE: Free T4: 1.2 ng/dL (ref 0.8–1.8)

## 2017-06-03 LAB — EXTRA LAV TOP TUBE

## 2017-06-03 MED ORDER — FLUOXETINE HCL 10 MG PO CAPS: 10.0000 mg | ORAL_CAPSULE | Freq: Every day | ORAL | 3 refills | Status: DC

## 2017-06-03 NOTE — Patient Instructions (Signed)
Begin Prozac 10 mg daily and follow-up in 4 weeks or sooner if necessary. Flu vaccine given today.
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
## 2017-06-18 DIAGNOSIS — H524 Presbyopia: Secondary | ICD-10-CM | POA: Diagnosis not present

## 2017-06-18 DIAGNOSIS — H01005 Unspecified blepharitis left lower eyelid: Secondary | ICD-10-CM | POA: Diagnosis not present

## 2017-06-18 DIAGNOSIS — H01001 Unspecified blepharitis right upper eyelid: Secondary | ICD-10-CM | POA: Diagnosis not present

## 2017-06-18 DIAGNOSIS — H01004 Unspecified blepharitis left upper eyelid: Secondary | ICD-10-CM | POA: Diagnosis not present

## 2017-06-18 DIAGNOSIS — H01002 Unspecified blepharitis right lower eyelid: Secondary | ICD-10-CM | POA: Diagnosis not present

## 2017-06-24 DIAGNOSIS — H10213 Acute toxic conjunctivitis, bilateral: Secondary | ICD-10-CM | POA: Diagnosis not present

## 2017-06-24 DIAGNOSIS — H16103 Unspecified superficial keratitis, bilateral: Secondary | ICD-10-CM | POA: Diagnosis not present

## 2017-06-24 DIAGNOSIS — H16143 Punctate keratitis, bilateral: Secondary | ICD-10-CM | POA: Diagnosis not present

## 2017-06-25 DIAGNOSIS — H16143 Punctate keratitis, bilateral: Secondary | ICD-10-CM | POA: Diagnosis not present

## 2017-06-25 DIAGNOSIS — H10213 Acute toxic conjunctivitis, bilateral: Secondary | ICD-10-CM | POA: Diagnosis not present

## 2017-06-25 DIAGNOSIS — H16103 Unspecified superficial keratitis, bilateral: Secondary | ICD-10-CM | POA: Diagnosis not present

## 2017-07-01 ENCOUNTER — Ambulatory Visit: Payer: PPO | Admitting: Internal Medicine

## 2017-07-08 ENCOUNTER — Ambulatory Visit (INDEPENDENT_AMBULATORY_CARE_PROVIDER_SITE_OTHER): Payer: PPO | Admitting: Internal Medicine

## 2017-07-08 ENCOUNTER — Encounter: Payer: Self-pay | Admitting: Internal Medicine

## 2017-07-08 VITALS — BP 116/70 | HR 63 | Temp 97.8°F | Wt 145.0 lb

## 2017-07-08 DIAGNOSIS — F439 Reaction to severe stress, unspecified: Secondary | ICD-10-CM

## 2017-07-08 DIAGNOSIS — F321 Major depressive disorder, single episode, moderate: Secondary | ICD-10-CM | POA: Diagnosis not present

## 2017-07-08 DIAGNOSIS — F411 Generalized anxiety disorder: Secondary | ICD-10-CM

## 2017-07-08 MED ORDER — ALPRAZOLAM 0.25 MG PO TABS: 0.2500 mg | ORAL_TABLET | Freq: Two times a day (BID) | ORAL | 0 refills | Status: DC | PRN

## 2017-07-08 MED ORDER — FLUOXETINE HCL 20 MG PO TABS: 20.0000 mg | ORAL_TABLET | Freq: Every day | ORAL | 3 refills | Status: DC

## 2017-07-08 NOTE — Patient Instructions (Signed)
Increase Prozac to 20 mg daily.  Take Xanax sparingly for anxiety/stress.  Return in 6 weeks.  Continue counseling.
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
## 2017-08-14 ENCOUNTER — Other Ambulatory Visit: Payer: Self-pay | Admitting: Internal Medicine

## 2017-08-14 NOTE — Telephone Encounter (Signed)
Verbal order by Dr. Renold Genta to refill Alprazolam 0.25 mg.  #20 with 5 refills.  Called to [facility] @ [address]

## 2017-08-15 NOTE — Telephone Encounter (Signed)
Refilled by phone

## 2017-08-19 ENCOUNTER — Ambulatory Visit: Payer: PPO | Admitting: Internal Medicine

## 2017-08-20 ENCOUNTER — Ambulatory Visit: Payer: PPO | Admitting: Internal Medicine

## 2017-09-09 ENCOUNTER — Ambulatory Visit: Payer: PPO | Admitting: Internal Medicine

## 2017-09-09 ENCOUNTER — Other Ambulatory Visit: Payer: Self-pay

## 2017-09-09 MED ORDER — FLUOXETINE HCL 20 MG PO TABS: 20.0000 mg | ORAL_TABLET | Freq: Every day | ORAL | 0 refills | Status: DC

## 2017-09-12 ENCOUNTER — Ambulatory Visit (INDEPENDENT_AMBULATORY_CARE_PROVIDER_SITE_OTHER): Payer: PPO | Admitting: Internal Medicine

## 2017-09-12 ENCOUNTER — Encounter: Payer: Self-pay | Admitting: Internal Medicine

## 2017-09-12 VITALS — BP 120/70 | HR 76 | Temp 98.2°F | Ht 63.0 in | Wt 145.1 lb

## 2017-09-12 DIAGNOSIS — F411 Generalized anxiety disorder: Secondary | ICD-10-CM | POA: Diagnosis not present

## 2017-09-12 DIAGNOSIS — F439 Reaction to severe stress, unspecified: Secondary | ICD-10-CM | POA: Diagnosis not present

## 2017-09-12 DIAGNOSIS — F321 Major depressive disorder, single episode, moderate: Secondary | ICD-10-CM

## 2017-09-28 NOTE — Progress Notes (Signed)
   Subjective:    Patient ID: Faith Miles, female    DOB: 09/24/1945, 73 y.o.   MRN: [id number]  HPI Patient in today to follow-up on anxiety and depression.  Husband remains at [facility] and there has been more situational stress with him.  She is planning on meeting with his daughter today to discuss long-term care plan and financial situation.  She thought about discontinuing Prozac and Xanax but now is reconsidering that given her husband's failure to progress mentally and physically.  She is anxious about the future.    Review of Systems see above    Objective:   Physical Exam   She was not examined today.  I spent 15 minutes speaking with her about the situation.  I do not think she should discontinue her medications for anxiety depression at this time.  She continues to receive counseling.     Assessment & Plan:  Anxiety  Depression  Situational stress  Plan: Continue Lexapro and Xanax.  Follow-up in 3-6 months.  Last physical exam was December 2017.

## 2017-09-28 NOTE — Patient Instructions (Addendum)
Continue Prozac daily and antianxiety medication as needed.  Follow-up in 3-6 months.  Physical examination as needed in the near future.  Last physical exam was December 2017.  Continue counseling

## 2017-10-08 ENCOUNTER — Telehealth: Payer: Self-pay | Admitting: Internal Medicine

## 2017-10-08 MED ORDER — FLUOXETINE HCL 20 MG PO TABS: 20.0000 mg | ORAL_TABLET | Freq: Every day | ORAL | 5 refills | Status: DC

## 2017-10-08 NOTE — Telephone Encounter (Signed)
Calling to request refill on her Prozac.    Pharmacy:  [facility] @ [place].  Thank you.

## 2017-10-08 NOTE — Telephone Encounter (Signed)
Refill x 6 months 

## 2017-12-02 DIAGNOSIS — M25562 Pain in left knee: Secondary | ICD-10-CM | POA: Diagnosis not present

## 2017-12-02 DIAGNOSIS — M25561 Pain in right knee: Secondary | ICD-10-CM | POA: Diagnosis not present

## 2017-12-04 ENCOUNTER — Other Ambulatory Visit: Payer: Self-pay | Admitting: Internal Medicine

## 2017-12-04 DIAGNOSIS — E785 Hyperlipidemia, unspecified: Secondary | ICD-10-CM

## 2017-12-04 DIAGNOSIS — M8580 Other specified disorders of bone density and structure, unspecified site: Secondary | ICD-10-CM

## 2017-12-04 DIAGNOSIS — Z0000 Encounter for general adult medical examination without abnormal findings: Secondary | ICD-10-CM

## 2017-12-04 DIAGNOSIS — Z8639 Personal history of other endocrine, nutritional and metabolic disease: Secondary | ICD-10-CM

## 2017-12-10 ENCOUNTER — Other Ambulatory Visit: Payer: PPO | Admitting: Internal Medicine

## 2017-12-10 DIAGNOSIS — Z8639 Personal history of other endocrine, nutritional and metabolic disease: Secondary | ICD-10-CM | POA: Diagnosis not present

## 2017-12-10 DIAGNOSIS — E785 Hyperlipidemia, unspecified: Secondary | ICD-10-CM | POA: Diagnosis not present

## 2017-12-10 DIAGNOSIS — M8580 Other specified disorders of bone density and structure, unspecified site: Secondary | ICD-10-CM | POA: Diagnosis not present

## 2017-12-10 DIAGNOSIS — Z0000 Encounter for general adult medical examination without abnormal findings: Secondary | ICD-10-CM

## 2017-12-11 LAB — CBC WITH DIFFERENTIAL/PLATELET
BASOS ABS: 52 {cells}/uL (ref 0–200)
Basophils Relative: 0.8 %
EOS ABS: 72 {cells}/uL (ref 15–500)
Eosinophils Relative: 1.1 %
HEMATOCRIT: 40.3 % (ref 35.0–45.0)
HEMOGLOBIN: 13.8 g/dL (ref 11.7–15.5)
LYMPHS ABS: 1911 {cells}/uL (ref 850–3900)
MCH: 30.8 pg (ref 27.0–33.0)
MCHC: 34.2 g/dL (ref 32.0–36.0)
MCV: 90 fL (ref 80.0–100.0)
MONOS PCT: 5.7 %
MPV: 10 fL (ref 7.5–12.5)
NEUTROS ABS: 4095 {cells}/uL (ref 1500–7800)
Neutrophils Relative %: 63 %
Platelets: 354 10*3/uL (ref 140–400)
RBC: 4.48 10*6/uL (ref 3.80–5.10)
RDW: 13 % (ref 11.0–15.0)
Total Lymphocyte: 29.4 %
WBC: 6.5 10*3/uL (ref 3.8–10.8)
WBCMIX: 371 {cells}/uL (ref 200–950)

## 2017-12-11 LAB — COMPLETE METABOLIC PANEL WITH GFR
AG RATIO: 1.5 (calc) (ref 1.0–2.5)
ALT: 17 U/L (ref 6–29)
AST: 16 U/L (ref 10–35)
Albumin: 4.2 g/dL (ref 3.6–5.1)
Alkaline phosphatase (APISO): 60 U/L (ref 33–130)
BUN: 16 mg/dL (ref 7–25)
CALCIUM: 9.2 mg/dL (ref 8.6–10.4)
CO2: 27 mmol/L (ref 20–32)
CREATININE: 0.68 mg/dL (ref 0.60–0.93)
Chloride: 103 mmol/L (ref 98–110)
GFR, EST NON AFRICAN AMERICAN: 87 mL/min/{1.73_m2} (ref >=60)
GFR, Est African American: 101 mL/min/{1.73_m2} (ref >=60)
Globulin: 2.8 g/dL (calc) (ref 1.9–3.7)
Glucose, Bld: 93 mg/dL (ref 65–99)
POTASSIUM: 4.9 mmol/L (ref 3.5–5.3)
Sodium: 138 mmol/L (ref 135–146)
TOTAL PROTEIN: 7 g/dL (ref 6.1–8.1)
Total Bilirubin: 1.1 mg/dL (ref 0.2–1.2)

## 2017-12-11 LAB — VITAMIN D 25 HYDROXY (VIT D DEFICIENCY, FRACTURES): VIT D 25 HYDROXY: 28 ng/mL — AB (ref 30–100)

## 2017-12-11 LAB — LIPID PANEL
CHOL/HDL RATIO: 2.5 (calc) (ref <5.0)
Cholesterol: 216 mg/dL — ABNORMAL HIGH (ref <200)
HDL: 85 mg/dL (ref >50)
LDL Cholesterol (Calc): 113 mg/dL (calc) — ABNORMAL HIGH
NON-HDL CHOLESTEROL (CALC): 131 mg/dL — AB (ref <130)
Triglycerides: 79 mg/dL (ref <150)

## 2017-12-11 LAB — TSH: TSH: 2.52 mIU/L (ref 0.40–4.50)

## 2017-12-12 ENCOUNTER — Other Ambulatory Visit: Payer: PPO | Admitting: Internal Medicine

## 2017-12-16 ENCOUNTER — Ambulatory Visit (INDEPENDENT_AMBULATORY_CARE_PROVIDER_SITE_OTHER): Payer: PPO | Admitting: Internal Medicine

## 2017-12-16 ENCOUNTER — Encounter: Payer: Self-pay | Admitting: Internal Medicine

## 2017-12-16 VITALS — BP 102/70 | HR 77 | Ht 62.5 in | Wt 144.0 lb

## 2017-12-16 DIAGNOSIS — H903 Sensorineural hearing loss, bilateral: Secondary | ICD-10-CM

## 2017-12-16 DIAGNOSIS — Z0000 Encounter for general adult medical examination without abnormal findings: Secondary | ICD-10-CM | POA: Diagnosis not present

## 2017-12-16 DIAGNOSIS — F439 Reaction to severe stress, unspecified: Secondary | ICD-10-CM

## 2017-12-16 DIAGNOSIS — F321 Major depressive disorder, single episode, moderate: Secondary | ICD-10-CM

## 2017-12-16 DIAGNOSIS — F411 Generalized anxiety disorder: Secondary | ICD-10-CM

## 2017-12-16 DIAGNOSIS — M8580 Other specified disorders of bone density and structure, unspecified site: Secondary | ICD-10-CM | POA: Diagnosis not present

## 2017-12-16 DIAGNOSIS — Z8639 Personal history of other endocrine, nutritional and metabolic disease: Secondary | ICD-10-CM | POA: Diagnosis not present

## 2017-12-16 LAB — POCT URINALYSIS DIPSTICK
APPEARANCE: NORMAL
Bilirubin, UA: NEGATIVE
Glucose, UA: NEGATIVE
Ketones, UA: NEGATIVE
Leukocytes, UA: NEGATIVE
Nitrite, UA: NEGATIVE
ODOR: NORMAL
PH UA: 6 (ref 5.0–8.0)
Protein, UA: NEGATIVE
Spec Grav, UA: 1.02 (ref 1.010–1.025)
Urobilinogen, UA: 0.2 E.U./dL

## 2017-12-17 DIAGNOSIS — H16223 Keratoconjunctivitis sicca, not specified as Sjogren's, bilateral: Secondary | ICD-10-CM | POA: Diagnosis not present

## 2017-12-17 DIAGNOSIS — H04123 Dry eye syndrome of bilateral lacrimal glands: Secondary | ICD-10-CM | POA: Diagnosis not present

## 2017-12-17 DIAGNOSIS — H40023 Open angle with borderline findings, high risk, bilateral: Secondary | ICD-10-CM | POA: Diagnosis not present

## 2018-01-05 NOTE — Patient Instructions (Signed)
Continue Prozac and antianxiety medication.  Take 2000 units vitamin D3 daily.  Follow-up in 6 months.  Continue counseling.

## 2018-01-05 NOTE — Progress Notes (Addendum)
Subjective:    Patient ID: Faith Miles, female    DOB: [birth year], 72 y.o.   MRN: [id number]  HPI 72 year old White Female in today for Medicare wellness, health maintenance exam and evaluation of issues.  Continues with situational stress. She and her husband are divorcing.  He is living at [facility]. They are selling their home.  She just purchased a home at [facility] for herself.  She seems less anxious than on previous visits.  Continues with Prozac and Xanax.  Continues with counseling.  History of anxiety, osteopenia, osteoarthritis of the left knee.  Wears hearing aids both ears due to bilateral hearing loss.  Immunizations are up-to-date.   History of basal cell carcinoma behind right ear and right leg in late February 2013.  She is followed by Dr. Ronnald Ramp at [facility].  Past medical history: Tubal ligation 1982.  Tonsillectomy in childhood.  History of glaucoma.  Left Bell's palsy August 2003.  Surgery for bilateral bunions by podiatrist in 2011.  History of bruxism and TMJ syndrome.  Social history: Occasionally smoked in the remote past but not for a number of years.  She has a Education officer, community.  Social alcohol consumption.  She and her husband retired from [employer].  Family history: Mother died at age 21 with Alzheimer's disease.  Father died at age 15 with diabetes and coronary disease.  Both parents with history of hypertension.  3 sisters in good health.  One daughter is bipolar.  One son in good health.  Colonoscopy done January 2015.         Review of Systems anxiety and situational stress     Objective:   Physical Exam  Constitutional: She is oriented to person, place, and time. She appears well-developed and well-nourished. No distress.  HENT:  Head: Normocephalic and atraumatic.  Right Ear: External ear normal.  Left Ear: External ear normal.  Nose: Nose normal.  Mouth/Throat: Oropharynx is clear and moist.  Eyes: Pupils are  equal, round, and reactive to light. EOM are normal. Right eye exhibits no discharge. Left eye exhibits no discharge. No scleral icterus.  Neck: Neck supple. No JVD present. No thyromegaly present.  Cardiovascular: Regular rhythm, normal heart sounds and intact distal pulses.  No murmur heard. Pulmonary/Chest: Effort normal and breath sounds normal. No stridor. No respiratory distress.  Breasts normal female  Abdominal: Soft. Bowel sounds are normal. She exhibits no distension and no mass. There is no tenderness. There is no rebound and no guarding.  Genitourinary:  Genitourinary Comments: Pap taken 2015 and will not be repeated due to age.  Bimanual normal.  Musculoskeletal: She exhibits no edema.  Lymphadenopathy:    She has no cervical adenopathy.  Neurological: She is alert and oriented to person, place, and time. She displays normal reflexes. No cranial nerve deficit. Coordination normal.  Skin: Skin is warm and dry. No rash noted. She is not diaphoretic.  Psychiatric: She has a normal mood and affect. Her behavior is normal. Judgment and thought content normal.          Assessment & Plan:  Situational stress with husband and divorce.  She seems a bit calmer about all of this today  History of glaucoma  History of skin cancer  Musculoskeletal pain due to osteoarthritis in knees  Bilateral hearing loss-wears bilateral hearing aids  Anxiety  Vitamin D deficiency-take 2000 units vitamin D3 daily  Plan: Continue with Prozac and antianxiety medication.  Continue with counseling.  Take 2000  units vitamin D3 daily.  Follow-up here in 6 months.  Subjective:   Patient presents for Medicare Annual/Subsequent preventive examination.  Review Past Medical/Family/Social: See above   Risk Factors  Current exercise habits: Does some light exercise Dietary issues discussed: Low-fat low carbohydrate advised  Cardiac risk factors: Family history and father  Depression Screen    (Note: if answer to either of the following is "Yes", a more complete depression screening is indicated)   Over the past two weeks, have you felt down, depressed or hopeless?  Yes due to situational stress Over the past two weeks, have you felt little interest or pleasure in doing things?  Yes due to situational stress some due to situational stress Have you lost interest or pleasure in daily life?  Do you often feel hopeless? No Do you cry easily over simple problems? No   Activities of Daily Living  In your present state of health, do you have any difficulty performing the following activities?:   Driving? No  Managing money? No  Feeding yourself? No  Getting from bed to chair? No  Climbing a flight of stairs?  Sometimes Preparing food and eating?: No  Bathing or showering? No  Getting dressed: No  Getting to the toilet? No  Using the toilet:No  Moving around from place to place: No  In the past year have you fallen or had a near fall?:No  Are you sexually active? No  Do you have more than one partner? No   Hearing Difficulties: Yes Do you often ask people to speak up or repeat themselves?  Yes I have hearing loss Do you experience ringing or noises in your ears?  Sometimes Do you have difficulty understanding soft or whispered voices? No  Do you feel that you have a problem with memory?  Sometimes Do you often misplace items?  Sometimes   Home Safety:  Do you have a smoke alarm at your residence? Yes Do you have grab bars in the bathroom?  Yes Do you have throw rugs in your house-yes   Cognitive Testing  Alert? Yes Normal Appearance?Yes  Oriented to person? Yes Place? Yes  Time? Yes  Recall of three objects? Yes  Can perform simple calculations? Yes  Displays appropriate judgment?Yes  Can read the correct time from a watch face?Yes   List the Names of Other Physician/Practitioners you currently use:  See referral list for the physicians patient is currently  seeing.     Review of Systems: See above   Objective:     General appearance: Appears stated age  Head: Normocephalic, without obvious abnormality, atraumatic  Eyes: conj clear, EOMi PEERLA  Ears: normal TM's and external ear canals both ears  Nose: Nares normal. Septum midline. Mucosa normal. No drainage or sinus tenderness.  Throat: lips, mucosa, and tongue normal; teeth and gums normal  Neck: no adenopathy, no carotid bruit, no JVD, supple, symmetrical, trachea midline and thyroid not enlarged, symmetric, no tenderness/mass/nodules  No CVA tenderness.  Lungs: clear to auscultation bilaterally  Breasts: normal appearance, no masses or tenderness Heart: regular rate and rhythm, S1, S2 normal, no murmur, click, rub or gallop  Abdomen: soft, non-tender; bowel sounds normal; no masses, no organomegaly  Musculoskeletal: ROM normal in all joints, no crepitus, no deformity, Normal muscle strengthen. Back  is symmetric, no curvature. Skin: Skin color, texture, turgor normal. No rashes or lesions  Lymph nodes: Cervical, supraclavicular, and axillary nodes normal.  Neurologic: CN 2 -12 Normal, Normal symmetric reflexes. Normal  coordination and gait  Psych: Alert & Oriented x 3, Mood appear stable.    Assessment:    Annual wellness medicare exam   Plan:    During the course of the visit the patient was educated and counseled about appropriate screening and preventive services including:   Annual mammogram  Annual flu vaccine     Patient Instructions (the written plan) was given to the patient.  Medicare Attestation  I have personally reviewed:  The patient's medical and social history  Their use of alcohol, tobacco or illicit drugs  Their current medications and supplements  The patient's functional ability including ADLs,fall risks, home safety risks, cognitive, and hearing and visual impairment  Diet and physical activities  Evidence for depression or mood disorders  The  patient's weight, height, BMI, and visual acuity have been recorded in the chart. I have made referrals, counseling, and provided education to the patient based on review of the above and I have provided the patient with a written personalized care plan for preventive services.

## 2018-01-13 ENCOUNTER — Telehealth: Payer: Self-pay | Admitting: Internal Medicine

## 2018-01-13 NOTE — Telephone Encounter (Signed)
Left detailed message.   

## 2018-01-13 NOTE — Telephone Encounter (Signed)
Noela Brothers Self [phone]  T[facility] called and wants to know if she has had her 2nd shingle and PNA shots.

## 2018-01-22 ENCOUNTER — Telehealth: Payer: Self-pay | Admitting: Internal Medicine

## 2018-01-22 NOTE — Telephone Encounter (Signed)
Faith Miles Self [phone]  [place] called to say that she was taking Fluoxetine 10 mg then went to Fluoxetine 20 mg, but she has now for the last 3 days gone back to the 10mg  and is feeling fine, so she would like to get a refill for Fluoxetine 10mg .

## 2018-01-23 MED ORDER — FLUOXETINE HCL 10 MG PO CAPS: 10.0000 mg | ORAL_CAPSULE | Freq: Every day | ORAL | 5 refills | Status: DC

## 2018-01-23 NOTE — Telephone Encounter (Signed)
Done

## 2018-01-23 NOTE — Telephone Encounter (Signed)
Refill prozac 10 mg daily x 6 months and d/c 20 mg

## 2018-02-04 DIAGNOSIS — H04123 Dry eye syndrome of bilateral lacrimal glands: Secondary | ICD-10-CM | POA: Diagnosis not present

## 2018-02-04 DIAGNOSIS — H401131 Primary open-angle glaucoma, bilateral, mild stage: Secondary | ICD-10-CM | POA: Diagnosis not present

## 2018-02-04 DIAGNOSIS — H16223 Keratoconjunctivitis sicca, not specified as Sjogren's, bilateral: Secondary | ICD-10-CM | POA: Diagnosis not present

## 2018-02-07 ENCOUNTER — Other Ambulatory Visit: Payer: Self-pay | Admitting: Internal Medicine

## 2018-02-07 DIAGNOSIS — Z1231 Encounter for screening mammogram for malignant neoplasm of breast: Secondary | ICD-10-CM

## 2018-02-12 DIAGNOSIS — M170 Bilateral primary osteoarthritis of knee: Secondary | ICD-10-CM | POA: Diagnosis not present

## 2018-02-17 ENCOUNTER — Ambulatory Visit
Admission: RE | Admit: 2018-02-17 | Discharge: 2018-02-17 | Disposition: A | Discharge status: Home / Self Care | Payer: PPO | Source: Ambulatory Visit | Attending: Internal Medicine | Admitting: Internal Medicine

## 2018-02-17 DIAGNOSIS — Z1231 Encounter for screening mammogram for malignant neoplasm of breast: Secondary | ICD-10-CM | POA: Diagnosis not present

## 2018-02-20 DIAGNOSIS — M25562 Pain in left knee: Secondary | ICD-10-CM | POA: Diagnosis not present

## 2018-02-20 DIAGNOSIS — M1712 Unilateral primary osteoarthritis, left knee: Secondary | ICD-10-CM | POA: Diagnosis not present

## 2018-02-20 DIAGNOSIS — M25561 Pain in right knee: Secondary | ICD-10-CM | POA: Diagnosis not present

## 2018-02-20 DIAGNOSIS — M1711 Unilateral primary osteoarthritis, right knee: Secondary | ICD-10-CM | POA: Diagnosis not present

## 2018-03-03 DIAGNOSIS — M170 Bilateral primary osteoarthritis of knee: Secondary | ICD-10-CM | POA: Diagnosis not present

## 2018-03-04 DIAGNOSIS — H02054 Trichiasis without entropian left upper eyelid: Secondary | ICD-10-CM | POA: Diagnosis not present

## 2018-03-18 ENCOUNTER — Ambulatory Visit (INDEPENDENT_AMBULATORY_CARE_PROVIDER_SITE_OTHER): Payer: PPO | Admitting: Internal Medicine

## 2018-03-18 ENCOUNTER — Encounter: Payer: Self-pay | Admitting: Internal Medicine

## 2018-03-18 VITALS — BP 102/60 | HR 71 | Ht 62.5 in | Wt 147.0 lb

## 2018-03-18 DIAGNOSIS — Z8669 Personal history of other diseases of the nervous system and sense organs: Secondary | ICD-10-CM

## 2018-03-18 DIAGNOSIS — F439 Reaction to severe stress, unspecified: Secondary | ICD-10-CM | POA: Diagnosis not present

## 2018-03-18 DIAGNOSIS — F411 Generalized anxiety disorder: Secondary | ICD-10-CM

## 2018-03-18 DIAGNOSIS — H04123 Dry eye syndrome of bilateral lacrimal glands: Secondary | ICD-10-CM

## 2018-03-19 NOTE — Patient Instructions (Signed)
Continue Prozac 10 mg daily for nail.  If you consider tapering it do so every other day for 30 days then twice weekly for 30 days before stopping.  Please see staff at [facility] regarding vitamin recommendations

## 2018-03-19 NOTE — Progress Notes (Signed)
   Subjective:    Patient ID: Faith Miles, female    DOB: 1945/12/14, 72 y.o.   MRN: [id number]  HPI  72 year old Female in today for discussion regarding anxiety depression and possible weaning of her medication.  She is seeing a Social worker.  Counselor asked that we give her some advice as to whether or not she should be tapering her medication.  She is on Prozac.  It has a long half-life.  Situation has improved some.  She now has her own place.  They have sold their house here.  They are in the process of selling their beach house.  Husband remains at [facility].  Patient's son who is in [employer] business apparently lost the restaurant.  It seems that she is still under a fair amount of stress although he she is interested in tapering her medication.  He does have Xanax to take on a as needed basis.  Currently taking Prozac 10 mg daily which really is a small dose of medication.  She wants to taper it I suggested that she taper to every other day for 4 weeks and then twice weekly for 4 weeks.  However I think she really should be on it longer given persistent situational stress.  I do think she is better and less anxious.  She started taking Prozac in September 2018.  Explained to her it was not unusual to take this medication for 6 months to a year or longer.  There is a possibility that depression symptoms will return if she tapers off medication.  She says she may consider taking it for several more weeks before tapering it.  She is also has glaucoma and apparently dry eyes.  Was told by ophthalmologist Dr. Posey Pronto with Dr. Rachael Fee office recently to take vitamin A supplement.  She brings in several over-the-counter vitamin bottles.  2 bottles both contain a fair amount of vitamin K and concerned about that amount.  Reminded her that vitamin A is a fat-soluble vitamin.  I suggested she go see staff at [facility] and they can certainly help her with the correct amounts of these  vitamins.    Review of Systems     Objective:   Physical Exam  Spent 20 minutes speaking with her about these issues today.  Her anxiety level seems less but still detect some anxiety in her voice and discussion today.      Assessment & Plan:  Anxiety state  Depression  Plan: I still think she has some grief reaction over the loss of her marriage and situation.  Recently found out that her son has lost his restaurant.  I have suggested she continue with Prozac for now and consider tapering it later on in the summer when things have settled down a bit.

## 2018-04-09 DIAGNOSIS — M1711 Unilateral primary osteoarthritis, right knee: Secondary | ICD-10-CM | POA: Diagnosis not present

## 2018-04-09 DIAGNOSIS — M1712 Unilateral primary osteoarthritis, left knee: Secondary | ICD-10-CM | POA: Diagnosis not present

## 2018-04-09 DIAGNOSIS — M25562 Pain in left knee: Secondary | ICD-10-CM | POA: Diagnosis not present

## 2018-05-03 ENCOUNTER — Other Ambulatory Visit: Payer: Self-pay | Admitting: Internal Medicine

## 2018-05-03 DIAGNOSIS — F321 Major depressive disorder, single episode, moderate: Secondary | ICD-10-CM

## 2018-06-16 ENCOUNTER — Encounter: Payer: Self-pay | Admitting: Internal Medicine

## 2018-06-16 ENCOUNTER — Ambulatory Visit (INDEPENDENT_AMBULATORY_CARE_PROVIDER_SITE_OTHER): Payer: PPO | Admitting: Internal Medicine

## 2018-06-16 VITALS — BP 102/68 | HR 79 | Temp 99.2°F | Ht 62.5 in | Wt 149.0 lb

## 2018-06-16 DIAGNOSIS — Z23 Encounter for immunization: Secondary | ICD-10-CM | POA: Diagnosis not present

## 2018-06-16 DIAGNOSIS — F439 Reaction to severe stress, unspecified: Secondary | ICD-10-CM | POA: Diagnosis not present

## 2018-06-16 DIAGNOSIS — M25561 Pain in right knee: Secondary | ICD-10-CM | POA: Diagnosis not present

## 2018-06-16 DIAGNOSIS — E7800 Pure hypercholesterolemia, unspecified: Secondary | ICD-10-CM

## 2018-06-16 DIAGNOSIS — Z8639 Personal history of other endocrine, nutritional and metabolic disease: Secondary | ICD-10-CM | POA: Diagnosis not present

## 2018-06-16 DIAGNOSIS — M25562 Pain in left knee: Secondary | ICD-10-CM

## 2018-06-17 DIAGNOSIS — M25562 Pain in left knee: Secondary | ICD-10-CM | POA: Diagnosis not present

## 2018-06-17 DIAGNOSIS — H401131 Primary open-angle glaucoma, bilateral, mild stage: Secondary | ICD-10-CM | POA: Diagnosis not present

## 2018-06-17 DIAGNOSIS — H04121 Dry eye syndrome of right lacrimal gland: Secondary | ICD-10-CM | POA: Diagnosis not present

## 2018-06-17 DIAGNOSIS — M25561 Pain in right knee: Secondary | ICD-10-CM | POA: Diagnosis not present

## 2018-06-17 DIAGNOSIS — H16223 Keratoconjunctivitis sicca, not specified as Sjogren's, bilateral: Secondary | ICD-10-CM | POA: Diagnosis not present

## 2018-06-20 DIAGNOSIS — M25562 Pain in left knee: Secondary | ICD-10-CM | POA: Diagnosis not present

## 2018-06-20 DIAGNOSIS — M1712 Unilateral primary osteoarthritis, left knee: Secondary | ICD-10-CM | POA: Diagnosis not present

## 2018-06-20 DIAGNOSIS — M1711 Unilateral primary osteoarthritis, right knee: Secondary | ICD-10-CM | POA: Diagnosis not present

## 2018-06-20 DIAGNOSIS — M25561 Pain in right knee: Secondary | ICD-10-CM | POA: Diagnosis not present

## 2018-06-26 ENCOUNTER — Ambulatory Visit (INDEPENDENT_AMBULATORY_CARE_PROVIDER_SITE_OTHER): Payer: PPO | Admitting: Internal Medicine

## 2018-06-26 ENCOUNTER — Encounter: Payer: Self-pay | Admitting: Internal Medicine

## 2018-06-26 VITALS — BP 102/62 | HR 78 | Temp 98.4°F | Ht 62.5 in | Wt 149.0 lb

## 2018-06-26 DIAGNOSIS — H903 Sensorineural hearing loss, bilateral: Secondary | ICD-10-CM | POA: Diagnosis not present

## 2018-06-26 DIAGNOSIS — Z8669 Personal history of other diseases of the nervous system and sense organs: Secondary | ICD-10-CM | POA: Diagnosis not present

## 2018-06-26 DIAGNOSIS — F439 Reaction to severe stress, unspecified: Secondary | ICD-10-CM | POA: Diagnosis not present

## 2018-06-26 DIAGNOSIS — F324 Major depressive disorder, single episode, in partial remission: Secondary | ICD-10-CM

## 2018-06-26 DIAGNOSIS — Z01818 Encounter for other preprocedural examination: Secondary | ICD-10-CM | POA: Diagnosis not present

## 2018-06-26 DIAGNOSIS — Z8639 Personal history of other endocrine, nutritional and metabolic disease: Secondary | ICD-10-CM | POA: Diagnosis not present

## 2018-06-26 DIAGNOSIS — E7800 Pure hypercholesterolemia, unspecified: Secondary | ICD-10-CM | POA: Diagnosis not present

## 2018-06-26 DIAGNOSIS — M8580 Other specified disorders of bone density and structure, unspecified site: Secondary | ICD-10-CM

## 2018-06-26 DIAGNOSIS — M1712 Unilateral primary osteoarthritis, left knee: Secondary | ICD-10-CM

## 2018-06-26 NOTE — Progress Notes (Signed)
Subjective:    Patient ID: Faith Miles, female    DOB: 02/18/1946, 72 y.o.   MRN: [id number]  HPI 72 year old Female in today for medical consultation in regard to upcoming left total knee arthroplasty with Dr. Alvan Dame.  She went on a trip recently and had considerable issues walking.  Feels that the time is right to have knee surgery.  She had complete physical exam here December 16, 2017.  At that time she had just purchased a home and felt manner for herself.  She and her husband divorced.  He is living at [facility] and his health is poor.  She is less anxious.  Remains on Prozac.  No longer taking Xanax for sleep and anxiety.  Has been in counseling.  History of glaucoma  History of anxiety, osteopenia and osteoarthritis of both knees.  Wears hearing aids both ears due to bilateral hearing loss.  Immunizations are up-to-date including recent flu vaccination  History of basal cell carcinoma behind right ear and right leg February 2013.  She sees Dr.Jones at [facility].  Past medical history: Tubal ligation 1982.  Tonsillectomy in childhood.  History of glaucoma.  Left Bell's palsy August 2003.  Surgery for bilateral bunions by podiatrist in 2011.  History of bruxism and TMJ syndrome in the remote past.  Social history: Occasionally smoked in the remote past but not for a number of years.  Has a for your college degree.  Social alcohol consumption.  She is retired from [employer].  She has a son and a daughter.  Family history: Mother died at age 60 with Alzheimer's disease.  Father died at age 78 with diabetes and coronary disease.  Both parents with history of hypertension.  3 sisters in good health.  Daughter is bipolar.  Son is in good health.  Colonoscopy done January 2015.    Review of Systems aside from left knee pain.  No new complaints.  Has agreed not to stop Prozac for now given the fact that she is getting ready to undergo TKA.     Objective:   Physical  Exam  Constitutional: She is oriented to person, place, and time. She appears well-developed and well-nourished. No distress.  HENT:  Head: Normocephalic and atraumatic.  Mouth/Throat: Oropharynx is clear and moist.  Eyes: Pupils are equal, round, and reactive to light. Conjunctivae and EOM are normal.  Neck: No JVD present. No thyromegaly present.  Cardiovascular: Normal rate, regular rhythm and normal heart sounds.  No murmur heard. Pulmonary/Chest: Effort normal and breath sounds normal. No stridor. No respiratory distress. She has no wheezes.  Abdominal: Soft. Bowel sounds are normal. She exhibits no mass.  Musculoskeletal: She exhibits no edema.  Lymphadenopathy:    She has no cervical adenopathy.  Neurological: She is alert and oriented to person, place, and time.  Skin: Skin is warm and dry. She is not diaphoretic.  Psychiatric: She has a normal mood and affect. Her behavior is normal. Judgment and thought content normal.  Vitals reviewed.         Assessment & Plan:  Normal exam today  Normal EKG today  Primary osteoarthritis both knees-to have left TKA in the near future  History of mild hyperlipidemia with labs in April showing LDL cholesterol of 113 with total cholesterol of 216 with normal triglycerides.  Diet and exercise recommended at that time  History of vitamin D deficiency-in April level was 28.  At that time recommended 2000 units vitamin D3 daily  History of depression and situational stress-continue Prozac  History of glaucoma  History of sensorineural hearing loss wearing hearing aids  Plan: Form completed for approval for TKA from a medical standpoint.  Discussion about how she will manage at home.  She does have steps in the home.  She will likely need some help for short period of time.

## 2018-06-26 NOTE — Patient Instructions (Signed)
Continue same medications. Approved for left knee replacement.

## 2018-06-29 NOTE — Patient Instructions (Signed)
Continue with Prozac and additional 6 months follow-up in April 2020 at time of physical examination.

## 2018-06-29 NOTE — Progress Notes (Signed)
   Subjective:    Patient ID: Faith Miles, female    DOB: 23-Dec-1945, 72 y.o.   MRN: [id number]  HPI She is here today for six-month follow-up.  Is on Prozac for situational stress and depression which was started when she was having  issues with her husband who was chronically ill and was placed in [facility] living facility.  He has a history of alcohol abuse.  She also went to counseling.  When she was here in April for physical exam she was found to be slightly vitamin D deficient at 28.  Vitamin D over-the-counter supplement was recommended.  She had mild elevation of total cholesterol at 216 and LDL cholesterol of 113.  TSH was normal.  Tells me that she has been seeing Dr. Alvan Dame for issues with her knee since she returned from trip where she was having difficulty walking and keeping up with other tourist.  She thinks she may need to have knee arthroplasty.  She is asking about coming off of Prozac and I do not think this is a good idea at the present time.  She still has some situational stress.  Would like for her to continue with it.  She does have some Xanax on hand to take if needed for anxiety.  Flu vaccine given.    Review of Systems see above     Objective:   Physical Exam She was not examined today but spent 15 minutes speaking with her about these issues.       Assessment & Plan:  Situational stress  Pure hypercholesterolemia  History of vitamin D deficiency  Need for flu vaccine  Plan: Flu vaccine given.  Encourage patient continue with Prozac and reevaluate in April 2020.  Watch diet.  Continue walking for exercise.  Be sure and take vitamin D supplement.

## 2018-07-15 NOTE — Progress Notes (Signed)
06/27/2018-noted in [place]

## 2018-07-15 NOTE — Patient Instructions (Addendum)
Faith Miles  07/15/2018   Your procedure is scheduled on: Thursday 07/24/2018  Report to [facility] Main  Entrance              Report to admitting at   0750 AM    Call this number if you have problems the morning of surgery [phone]    Remember: Do not eat food or drink liquids :After Midnight.              BRUSH YOUR TEETH MORNING OF SURGERY AND RINSE YOUR MOUTH OUT, NO CHEWING GUM CANDY OR MINTS.     Take these medicines the morning of surgery with A SIP OF WATER: use eye drops                                  You may not have any metal on your body including hair pins and              piercings  Do not wear jewelry, make-up, lotions, powders or perfumes, deodorant             Do not wear nail polish.  Do not shave  48 hours prior to surgery.               Do not bring valuables to the hospital. [place].  Contacts, dentures or bridgework may not be worn into surgery.  Leave suitcase in the car. After surgery it may be brought to your room.                  Please read over the following fact sheets you were given: _____________________________________________________________________             [facility] - Preparing for Surgery Before surgery, you can play an important role.  Because skin is not sterile, your skin needs to be as free of germs as possible.  You can reduce the number of germs on your skin by washing with CHG (chlorahexidine gluconate) soap before surgery.  CHG is an antiseptic cleaner which kills germs and bonds with the skin to continue killing germs even after washing. Please DO NOT use if you have an allergy to CHG or antibacterial soaps.  If your skin becomes reddened/irritated stop using the CHG and inform your nurse when you arrive at Short Stay. Do not shave (including legs and underarms) for at least 48 hours prior to the first CHG shower.  You may shave your  face/neck. Please follow these instructions carefully:  1.  Shower with CHG Soap the night before surgery and the  morning of Surgery.  2.  If you choose to wash your hair, wash your hair first as usual with your  normal  shampoo.  3.  After you shampoo, rinse your hair and body thoroughly to remove the  shampoo.                           4.  Use CHG as you would any other liquid soap.  You can apply chg directly  to the skin and wash  Gently with a scrungie or clean washcloth.  5.  Apply the CHG Soap to your body ONLY FROM THE NECK DOWN.   Do not use on face/ open                           Wound or open sores. Avoid contact with eyes, ears mouth and genitals (private parts).                       Wash face,  Genitals (private parts) with your normal soap.             6.  Wash thoroughly, paying special attention to the area where your surgery  will be performed.  7.  Thoroughly rinse your body with warm water from the neck down.  8.  DO NOT shower/wash with your normal soap after using and rinsing off  the CHG Soap.                9.  Pat yourself dry with a clean towel.            10.  Wear clean pajamas.            11.  Place clean sheets on your bed the night of your first shower and do not  sleep with pets. Day of Surgery : Do not apply any lotions/deodorants the morning of surgery.  Please wear clean clothes to the hospital/surgery center.  FAILURE TO FOLLOW THESE INSTRUCTIONS MAY RESULT IN THE CANCELLATION OF YOUR SURGERY PATIENT SIGNATURE_________________________________  NURSE SIGNATURE__________________________________  ________________________________________________________________________   Faith Miles  An incentive spirometer is a tool that can help keep your lungs clear and active. This tool measures how well you are filling your lungs with each breath. Taking long deep breaths may help reverse or decrease the chance of developing breathing  (pulmonary) problems (especially infection) following:  A long period of time when you are unable to move or be active. BEFORE THE PROCEDURE   If the spirometer includes an indicator to show your best effort, your nurse or respiratory therapist will set it to a desired goal.  If possible, sit up straight or lean slightly forward. Try not to slouch.  Hold the incentive spirometer in an upright position. INSTRUCTIONS FOR USE  1. Sit on the edge of your bed if possible, or sit up as far as you can in bed or on a chair. 2. Hold the incentive spirometer in an upright position. 3. Breathe out normally. 4. Place the mouthpiece in your mouth and seal your lips tightly around it. 5. Breathe in slowly and as deeply as possible, raising the piston or the ball toward the top of the column. 6. Hold your breath for 3-5 seconds or for as long as possible. Allow the piston or ball to fall to the bottom of the column. 7. Remove the mouthpiece from your mouth and breathe out normally. 8. Rest for a few seconds and repeat Steps 1 through 7 at least 10 times every 1-2 hours when you are awake. Take your time and take a few normal breaths between deep breaths. 9. The spirometer may include an indicator to show your best effort. Use the indicator as a goal to work toward during each repetition. 10. After each set of 10 deep breaths, practice coughing to be sure your lungs are clear. If you have an incision (the cut made at the time of surgery),  support your incision when coughing by placing a pillow or rolled up towels firmly against it. Once you are able to get out of bed, walk around indoors and cough well. You may stop using the incentive spirometer when instructed by your caregiver.  RISKS AND COMPLICATIONS  Take your time so you do not get dizzy or light-headed.  If you are in pain, you may need to take or ask for pain medication before doing incentive spirometry. It is harder to take a deep breath if you  are having pain. AFTER USE  Rest and breathe slowly and easily.  It can be helpful to keep track of a log of your progress. Your caregiver can provide you with a simple table to help with this. If you are using the spirometer at home, follow these instructions: [place] IF:   You are having difficultly using the spirometer.  You have trouble using the spirometer as often as instructed.  Your pain medication is not giving enough relief while using the spirometer.  You develop fever of 100.5 F (38.1 C) or higher. SEEK IMMEDIATE MEDICAL CARE IF:   You cough up bloody sputum that had not been present before.  You develop fever of 102 F (38.9 C) or greater.  You develop worsening pain at or near the incision site. MAKE SURE YOU:   Understand these instructions.  Will watch your condition.  Will get help right away if you are not doing well or get worse. Document Released: 01/07/2007 Document Revised: 11/19/2011 Document Reviewed: 03/10/2007 [facility] Patient Information 2014 [place], [place].   ________________________________________________________________________

## 2018-07-16 ENCOUNTER — Encounter (HOSPITAL_COMMUNITY): Payer: Self-pay

## 2018-07-16 ENCOUNTER — Encounter (HOSPITAL_COMMUNITY)
Admission: RE | Admit: 2018-07-16 | Discharge: 2018-07-16 | Disposition: A | Discharge status: Home / Self Care | Payer: PPO | Source: Ambulatory Visit | Attending: Orthopedic Surgery | Admitting: Orthopedic Surgery

## 2018-07-16 ENCOUNTER — Other Ambulatory Visit: Payer: Self-pay

## 2018-07-16 DIAGNOSIS — Z01812 Encounter for preprocedural laboratory examination: Secondary | ICD-10-CM | POA: Diagnosis not present

## 2018-07-16 DIAGNOSIS — M1711 Unilateral primary osteoarthritis, right knee: Secondary | ICD-10-CM | POA: Insufficient documentation

## 2018-07-16 HISTORY — DX: Other complications of anesthesia, initial encounter: T88.59XA

## 2018-07-16 HISTORY — DX: Other specified postprocedural states: Z98.890

## 2018-07-16 HISTORY — DX: Adverse effect of unspecified anesthetic, initial encounter: T41.45XA

## 2018-07-16 HISTORY — DX: Nausea with vomiting, unspecified: R11.2

## 2018-07-16 LAB — BASIC METABOLIC PANEL
ANION GAP: 7 (ref 5–15)
BUN: 14 mg/dL (ref 8–23)
CO2: 27 mmol/L (ref 22–32)
Calcium: 8.9 mg/dL (ref 8.9–10.3)
Chloride: 105 mmol/L (ref 98–111)
Creatinine, Ser: 0.76 mg/dL (ref 0.44–1.00)
GFR calc Af Amer: >60 mL/min (ref >60)
GFR calc non Af Amer: >60 mL/min (ref >60)
GLUCOSE: 203 mg/dL — AB (ref 70–99)
POTASSIUM: 4.3 mmol/L (ref 3.5–5.1)
SODIUM: 139 mmol/L (ref 135–145)

## 2018-07-16 LAB — CBC
HCT: 41.6 % (ref 36.0–46.0)
HEMOGLOBIN: 13.5 g/dL (ref 12.0–15.0)
MCH: 31.4 pg (ref 26.0–34.0)
MCHC: 32.5 g/dL (ref 30.0–36.0)
MCV: 96.7 fL (ref 80.0–100.0)
NRBC: 0 % (ref 0.0–0.2)
Platelets: 301 10*3/uL (ref 150–400)
RBC: 4.3 MIL/uL (ref 3.87–5.11)
RDW: 13.3 % (ref 11.5–15.5)
WBC: 6.5 10*3/uL (ref 4.0–10.5)

## 2018-07-16 LAB — SURGICAL PCR SCREEN
MRSA, PCR: NEGATIVE
Staphylococcus aureus: NEGATIVE

## 2018-07-16 NOTE — Progress Notes (Signed)
Please place consent orders back in patient as I mistakenly released them for surgery on her left knee!  I understand that the patient wants bilateral knee replacements now. Thank you!

## 2018-07-17 DIAGNOSIS — R739 Hyperglycemia, unspecified: Secondary | ICD-10-CM | POA: Diagnosis not present

## 2018-07-17 LAB — ABO/RH: ABO/RH(D): O POS

## 2018-07-21 NOTE — H&P (Signed)
TOTAL KNEE ADMISSION H&P  Patient is being admitted for left total knee arthroplasty and right knee cortisone injections.  Subjective:  Chief Complaint:  Bilateral knee primary OA / pain  HPI: Faith Miles, 72 y.o. female, has a history of pain and functional disability in the left knee due to arthritis and has failed non-surgical conservative treatments for greater than 12 weeks to include NSAID's and/or analgesics, corticosteriod injections, viscosupplementation injections and activity modification.  Onset of symptoms was gradual, starting 2+ years ago with gradually worsening course since that time. The patient noted no past surgery on the left knee(s).  Patient currently rates pain in the left knee(s) at 7 out of 10 with activity. Patient has worsening of pain with activity and weight bearing, pain that interferes with activities of daily living, pain with passive range of motion, crepitus and joint swelling.  Patient has evidence of periarticular osteophytes and joint space narrowing by imaging studies.  There is no active infection.  Risks, benefits and expectations were discussed with the patient.  Risks including but not limited to the risk of anesthesia, blood clots, nerve damage, blood vessel damage, failure of the prosthesis, infection and up to and including death.  Patient understand the risks, benefits and expectations and wishes to proceed with surgery.   PCP: Elby Showers, MD  D/C Plans:       Home  Post-op Meds:       No Rx given  Tranexamic Acid:      To be given - IV   Decadron:      Is to be given  FYI:      ASA  [place]  DME:   Rx given for - RW   PT:   OPPT Rx given   Patient Active Problem List   Diagnosis Date Noted  . Osteopenia 10/12/2012  . Dry eyes 10/12/2012  . Anxiety 10/12/2012  . TMJ syndrome 10/12/2012  . History of squamous cell carcinoma of skin 10/09/2012  . Hyperlipidemia 10/09/2012  . Hearing loss 10/09/2012   Past Medical History:   Diagnosis Date  . Arthritis   . Cataract    surgery  . Complication of anesthesia   . Glaucoma   . History of Bell's palsy   . Insomnia   . Osteopenia   . PONV (postoperative nausea and vomiting)     Past Surgical History:  Procedure Laterality Date  . BUNIONECTOMY  12/01   bilateral  . CATARACT EXTRACTION Bilateral 2010  . TONSILLECTOMY    . TUBAL LIGATION      No current facility-administered medications for this encounter.    Current Outpatient Medications  Medication Sig Dispense Refill Last Dose  . cholecalciferol (VITAMIN D) 1000 UNITS tablet Take 1,000 Units by mouth daily.   Taking  . cycloSPORINE (RESTASIS) 0.05 % ophthalmic emulsion Place 1 drop into both eyes 2 (two) times daily.   Taking  . FLUoxetine (PROZAC) 10 MG capsule TAKE 1 CAPSULE(10 MG) BY MOUTH DAILY (Patient taking differently: Take 10 mg by mouth every evening. ) 30 capsule 1 Taking  . latanoprost (XALATAN) 0.005 % ophthalmic solution Place 1 drop into both eyes at bedtime.   3 Taking  . Multiple Vitamins-Minerals (CENTRUM SILVER PO) Take 1 tablet by mouth daily.    Taking  . Omega-3 Fatty Acids (FISH OIL PO) Take 1 capsule by mouth daily.    Taking   Allergies  Allergen Reactions  . Amoxicillin-Pot Clavulanate Nausea Only  . Morphine Other (See  Comments)    Unknown    Social History   Tobacco Use  . Smoking status: Former Smoker    Types: Cigarettes    Last attempt to quit: 03/19/2001    Years since quitting: 17.3  . Smokeless tobacco: Never Used  Substance Use Topics  . Alcohol use: Yes    Comment: occasional    Family History  Problem Relation Age of Onset  . Diabetes Mother   . Heart disease Mother   . Breast cancer Maternal Grandmother   . Colon cancer Neg Hx      Review of Systems  Constitutional: Negative.   HENT: Positive for hearing loss.   Eyes: Negative.   Respiratory: Negative.   Cardiovascular: Negative.   Gastrointestinal: Negative.   Genitourinary: Positive for  frequency.  Musculoskeletal: Positive for joint pain.  Skin: Negative.   Neurological: Negative.   Endo/Heme/Allergies: Negative.   Psychiatric/Behavioral: Negative.     Objective:  Physical Exam  Constitutional: She is oriented to person, place, and time. She appears well-developed.  HENT:  Head: Normocephalic.  Eyes: Pupils are equal, round, and reactive to light.  Neck: Neck supple. No JVD present. No tracheal deviation present. No thyromegaly present.  Cardiovascular: Normal rate, regular rhythm and intact distal pulses.  Respiratory: Effort normal and breath sounds normal. No respiratory distress. She has no wheezes.  GI: Soft. There is no tenderness. There is no guarding.  Musculoskeletal:       Left knee: She exhibits decreased range of motion, swelling and bony tenderness. She exhibits no ecchymosis, no deformity, no laceration and no erythema. Tenderness found.  Lymphadenopathy:    She has no cervical adenopathy.  Neurological: She is alert and oriented to person, place, and time.  Skin: Skin is warm and dry.  Psychiatric: She has a normal mood and affect.      Labs:  Estimated body mass index is 25.47 kg/m as calculated from the following:   Height as of 07/16/18: 5\' 4"  (1.626 m).   Weight as of 07/16/18: 67.3 kg.   Imaging Review Plain radiographs demonstrate severe degenerative joint disease of the left knee.  The bone quality appears to be good for age and reported activity level.   Preoperative templating of the joint replacement has been completed, documented, and submitted to the Operating Room personnel in order to optimize intra-operative equipment management.    Patient's anticipated LOS is less than 2 midnights, meeting these requirements: - Lives within 1 hour of care - Has a competent adult at home to recover with post-op recover - NO history of  - Chronic pain requiring opiods  - Diabetes  - Coronary Artery Disease  - Heart failure  - Heart  attack  - Stroke  - DVT/VTE  - Cardiac arrhythmia  - Respiratory Failure/COPD  - Renal failure  - Anemia  - Advanced Liver disease     Assessment/Plan:  End stage arthritis, left knee   The patient history, physical examination, clinical judgment of the provider and imaging studies are consistent with end stage degenerative joint disease of the left knee and total knee arthroplasty is deemed medically necessary. The treatment options including medical management, injection therapy arthroscopy and arthroplasty were discussed at length. The risks and benefits of total knee arthroplasty were presented and reviewed. The risks due to aseptic loosening, infection, stiffness, patella tracking problems, thromboembolic complications and other imponderables were discussed. The patient acknowledged the explanation, agreed to proceed with the plan and consent was signed. Patient is  being admitted for inpatient treatment for surgery, pain control, PT, OT, prophylactic antibiotics, VTE prophylaxis, progressive ambulation and ADL's and discharge planning. The patient is planning to be discharged home.    [place] Pugh Karl Erway   PA-C  07/21/2018, 7:32 AM

## 2018-07-24 ENCOUNTER — Inpatient Hospital Stay (HOSPITAL_COMMUNITY): Payer: PPO | Admitting: Anesthesiology

## 2018-07-24 ENCOUNTER — Encounter (HOSPITAL_COMMUNITY): Payer: Self-pay | Admitting: *Deleted

## 2018-07-24 ENCOUNTER — Encounter (HOSPITAL_COMMUNITY)
Admission: RE | Disposition: A | Discharge status: Home / Self Care | Payer: Self-pay | Source: Ambulatory Visit | Attending: Orthopedic Surgery

## 2018-07-24 ENCOUNTER — Inpatient Hospital Stay (HOSPITAL_COMMUNITY)
Admission: RE | Admit: 2018-07-24 | Discharge: 2018-07-25 | DRG: 470 | Disposition: A | Discharge status: Home / Self Care | Payer: PPO | Source: Ambulatory Visit | Attending: Orthopedic Surgery | Admitting: Orthopedic Surgery

## 2018-07-24 ENCOUNTER — Other Ambulatory Visit: Payer: Self-pay

## 2018-07-24 DIAGNOSIS — F419 Anxiety disorder, unspecified: Secondary | ICD-10-CM | POA: Diagnosis present

## 2018-07-24 DIAGNOSIS — E785 Hyperlipidemia, unspecified: Secondary | ICD-10-CM | POA: Diagnosis present

## 2018-07-24 DIAGNOSIS — Z888 Allergy status to other drugs, medicaments and biological substances status: Secondary | ICD-10-CM | POA: Diagnosis not present

## 2018-07-24 DIAGNOSIS — H409 Unspecified glaucoma: Secondary | ICD-10-CM | POA: Diagnosis present

## 2018-07-24 DIAGNOSIS — H9190 Unspecified hearing loss, unspecified ear: Secondary | ICD-10-CM | POA: Diagnosis present

## 2018-07-24 DIAGNOSIS — Z96652 Presence of left artificial knee joint: Secondary | ICD-10-CM

## 2018-07-24 DIAGNOSIS — M1712 Unilateral primary osteoarthritis, left knee: Secondary | ICD-10-CM | POA: Diagnosis not present

## 2018-07-24 DIAGNOSIS — M659 Synovitis and tenosynovitis, unspecified: Secondary | ICD-10-CM | POA: Diagnosis present

## 2018-07-24 DIAGNOSIS — M8580 Other specified disorders of bone density and structure, unspecified site: Secondary | ICD-10-CM | POA: Diagnosis present

## 2018-07-24 DIAGNOSIS — Z885 Allergy status to narcotic agent status: Secondary | ICD-10-CM

## 2018-07-24 DIAGNOSIS — Z85828 Personal history of other malignant neoplasm of skin: Secondary | ICD-10-CM | POA: Diagnosis not present

## 2018-07-24 DIAGNOSIS — M170 Bilateral primary osteoarthritis of knee: Secondary | ICD-10-CM | POA: Diagnosis present

## 2018-07-24 DIAGNOSIS — Z96659 Presence of unspecified artificial knee joint: Secondary | ICD-10-CM

## 2018-07-24 DIAGNOSIS — Z87891 Personal history of nicotine dependence: Secondary | ICD-10-CM | POA: Diagnosis not present

## 2018-07-24 DIAGNOSIS — Z79899 Other long term (current) drug therapy: Secondary | ICD-10-CM

## 2018-07-24 DIAGNOSIS — E663 Overweight: Secondary | ICD-10-CM | POA: Diagnosis present

## 2018-07-24 HISTORY — PX: TOTAL KNEE ARTHROPLASTY: SHX125

## 2018-07-24 HISTORY — PX: INJECTION KNEE: SHX2446

## 2018-07-24 LAB — TYPE AND SCREEN
ABO/RH(D): O POS
ANTIBODY SCREEN: NEGATIVE

## 2018-07-24 SURGERY — ARTHROPLASTY, KNEE, TOTAL
Anesthesia: Spinal | Laterality: Right

## 2018-07-24 MED ORDER — METOCLOPRAMIDE HCL 5 MG/ML IJ SOLN: 5.0000 mg | Freq: Three times a day (TID) | INTRAMUSCULAR | Status: DC | PRN

## 2018-07-24 MED ORDER — ASPIRIN 81 MG PO CHEW
81.0000 mg | CHEWABLE_TABLET | Freq: Two times a day (BID) | ORAL | Status: DC
  Administered 2018-07-24 – 2018-07-25 (×2): 81 mg via ORAL
  Filled 2018-07-24 (×2): qty 1

## 2018-07-24 MED ORDER — BISACODYL 10 MG RE SUPP: 10.0000 mg | Freq: Every day | RECTAL | Status: DC | PRN

## 2018-07-24 MED ORDER — DOCUSATE SODIUM 100 MG PO CAPS: 100.0000 mg | ORAL_CAPSULE | Freq: Two times a day (BID) | ORAL | 0 refills | Status: DC

## 2018-07-24 MED ORDER — LATANOPROST 0.005 % OP SOLN
1.0000 [drp] | Freq: Every day | OPHTHALMIC | Status: DC
  Administered 2018-07-24: 1 [drp] via OPHTHALMIC
  Filled 2018-07-24: qty 2.5

## 2018-07-24 MED ORDER — METHOCARBAMOL 500 MG PO TABS
500.0000 mg | ORAL_TABLET | Freq: Four times a day (QID) | ORAL | Status: DC | PRN
  Administered 2018-07-24: 500 mg via ORAL
  Filled 2018-07-24: qty 1

## 2018-07-24 MED ORDER — TRANEXAMIC ACID-NACL 1000-0.7 MG/100ML-% IV SOLN
1000.0000 mg | INTRAVENOUS | Status: AC
  Administered 2018-07-24: 1000 mg via INTRAVENOUS
  Filled 2018-07-24: qty 100

## 2018-07-24 MED ORDER — SODIUM CHLORIDE 0.9 % IR SOLN
Status: DC | PRN
  Administered 2018-07-24: 1000 mL

## 2018-07-24 MED ORDER — ONDANSETRON HCL 4 MG/2ML IJ SOLN
INTRAMUSCULAR | Status: DC | PRN
  Administered 2018-07-24: 4 mg via INTRAVENOUS

## 2018-07-24 MED ORDER — ROPIVACAINE HCL 7.5 MG/ML IJ SOLN
INTRAMUSCULAR | Status: DC | PRN
  Administered 2018-07-24: 20 mL via PERINEURAL

## 2018-07-24 MED ORDER — DEXAMETHASONE SODIUM PHOSPHATE 10 MG/ML IJ SOLN
10.0000 mg | Freq: Once | INTRAMUSCULAR | Status: AC
  Administered 2018-07-24: 10 mg via INTRAVENOUS

## 2018-07-24 MED ORDER — CEFAZOLIN SODIUM-DEXTROSE 2-4 GM/100ML-% IV SOLN
2.0000 g | INTRAVENOUS | Status: AC
  Administered 2018-07-24: 2 g via INTRAVENOUS
  Filled 2018-07-24: qty 100

## 2018-07-24 MED ORDER — METHYLPREDNISOLONE ACETATE 40 MG/ML IJ SUSP
INTRAMUSCULAR | Status: AC
  Filled 2018-07-24: qty 2

## 2018-07-24 MED ORDER — HYDROCODONE-ACETAMINOPHEN 5-325 MG PO TABS
1.0000 | ORAL_TABLET | ORAL | Status: DC | PRN
  Administered 2018-07-24 – 2018-07-25 (×5): 1 via ORAL
  Administered 2018-07-25 (×2): 2 via ORAL
  Filled 2018-07-24 (×2): qty 2
  Filled 2018-07-24: qty 1
  Filled 2018-07-24: qty 2
  Filled 2018-07-24 (×2): qty 1

## 2018-07-24 MED ORDER — PROPOFOL 10 MG/ML IV BOLUS
INTRAVENOUS | Status: AC
  Filled 2018-07-24: qty 40

## 2018-07-24 MED ORDER — POLYETHYLENE GLYCOL 3350 17 G PO PACK: 17.0000 g | PACK | Freq: Two times a day (BID) | ORAL | 0 refills | Status: DC

## 2018-07-24 MED ORDER — HYDROCODONE-ACETAMINOPHEN 7.5-325 MG PO TABS: 1.0000 | ORAL_TABLET | ORAL | 0 refills | Status: DC | PRN

## 2018-07-24 MED ORDER — KETOROLAC TROMETHAMINE 30 MG/ML IJ SOLN
INTRAMUSCULAR | Status: DC | PRN
  Administered 2018-07-24: 30 mg

## 2018-07-24 MED ORDER — BUPIVACAINE-EPINEPHRINE 0.25% -1:200000 IJ SOLN
INTRAMUSCULAR | Status: DC | PRN
  Administered 2018-07-24: 6 mL

## 2018-07-24 MED ORDER — 0.9 % SODIUM CHLORIDE (POUR BTL) OPTIME
TOPICAL | Status: DC | PRN
  Administered 2018-07-24: 1000 mL

## 2018-07-24 MED ORDER — FERROUS SULFATE 325 (65 FE) MG PO TABS: 325.0000 mg | ORAL_TABLET | Freq: Three times a day (TID) | ORAL | 3 refills | Status: DC

## 2018-07-24 MED ORDER — STERILE WATER FOR IRRIGATION IR SOLN
Status: DC | PRN
  Administered 2018-07-24: 2000 mL

## 2018-07-24 MED ORDER — CEFAZOLIN SODIUM-DEXTROSE 2-4 GM/100ML-% IV SOLN
2.0000 g | Freq: Four times a day (QID) | INTRAVENOUS | Status: AC
  Administered 2018-07-24 (×2): 2 g via INTRAVENOUS
  Filled 2018-07-24 (×2): qty 100

## 2018-07-24 MED ORDER — DEXAMETHASONE SODIUM PHOSPHATE 10 MG/ML IJ SOLN
10.0000 mg | Freq: Once | INTRAMUSCULAR | Status: AC
  Administered 2018-07-25: 10 mg via INTRAVENOUS
  Filled 2018-07-24: qty 1

## 2018-07-24 MED ORDER — SODIUM CHLORIDE (PF) 0.9 % IJ SOLN
INTRAMUSCULAR | Status: DC | PRN
  Administered 2018-07-24: 30 mL

## 2018-07-24 MED ORDER — DIPHENHYDRAMINE HCL 12.5 MG/5ML PO ELIX: 12.5000 mg | ORAL_SOLUTION | ORAL | Status: DC | PRN

## 2018-07-24 MED ORDER — MEPERIDINE HCL 50 MG/ML IJ SOLN: 6.2500 mg | INTRAMUSCULAR | Status: DC | PRN

## 2018-07-24 MED ORDER — METOCLOPRAMIDE HCL 5 MG PO TABS: 5.0000 mg | ORAL_TABLET | Freq: Three times a day (TID) | ORAL | Status: DC | PRN

## 2018-07-24 MED ORDER — SODIUM CHLORIDE 0.9 % IV SOLN
INTRAVENOUS | Status: DC | PRN
  Administered 2018-07-24: 25 ug/min via INTRAVENOUS

## 2018-07-24 MED ORDER — BUPIVACAINE-EPINEPHRINE (PF) 0.25% -1:200000 IJ SOLN
INTRAMUSCULAR | Status: DC | PRN
  Administered 2018-07-24: 30 mL via PERINEURAL

## 2018-07-24 MED ORDER — ACETAMINOPHEN 325 MG PO TABS: 325.0000 mg | ORAL_TABLET | Freq: Four times a day (QID) | ORAL | Status: DC | PRN

## 2018-07-24 MED ORDER — CHLORHEXIDINE GLUCONATE 4 % EX LIQD: 60.0000 mL | Freq: Once | CUTANEOUS | Status: DC

## 2018-07-24 MED ORDER — CYCLOSPORINE 0.05 % OP EMUL
1.0000 [drp] | Freq: Two times a day (BID) | OPHTHALMIC | Status: DC
  Administered 2018-07-24: 1 [drp] via OPHTHALMIC
  Filled 2018-07-24 (×2): qty 1

## 2018-07-24 MED ORDER — MAGNESIUM CITRATE PO SOLN: 1.0000 | Freq: Once | ORAL | Status: DC | PRN

## 2018-07-24 MED ORDER — KETOROLAC TROMETHAMINE 30 MG/ML IJ SOLN
INTRAMUSCULAR | Status: AC
  Filled 2018-07-24: qty 1

## 2018-07-24 MED ORDER — PHENOL 1.4 % MT LIQD: 1.0000 | OROMUCOSAL | Status: DC | PRN

## 2018-07-24 MED ORDER — FENTANYL CITRATE (PF) 100 MCG/2ML IJ SOLN
INTRAMUSCULAR | Status: AC
  Administered 2018-07-24: 50 ug via INTRAVENOUS
  Filled 2018-07-24: qty 2

## 2018-07-24 MED ORDER — POLYETHYLENE GLYCOL 3350 17 G PO PACK
17.0000 g | PACK | Freq: Two times a day (BID) | ORAL | Status: DC
  Administered 2018-07-24 – 2018-07-25 (×2): 17 g via ORAL
  Filled 2018-07-24 (×2): qty 1

## 2018-07-24 MED ORDER — FLUOXETINE HCL 10 MG PO CAPS
10.0000 mg | ORAL_CAPSULE | Freq: Every evening | ORAL | Status: DC
  Administered 2018-07-24: 10 mg via ORAL
  Filled 2018-07-24 (×2): qty 1

## 2018-07-24 MED ORDER — METHOCARBAMOL 500 MG PO TABS: 500.0000 mg | ORAL_TABLET | Freq: Four times a day (QID) | ORAL | 0 refills | Status: DC | PRN

## 2018-07-24 MED ORDER — FENTANYL CITRATE (PF) 100 MCG/2ML IJ SOLN: 25.0000 ug | INTRAMUSCULAR | Status: DC | PRN

## 2018-07-24 MED ORDER — FERROUS SULFATE 325 (65 FE) MG PO TABS
325.0000 mg | ORAL_TABLET | Freq: Two times a day (BID) | ORAL | Status: DC
  Administered 2018-07-25: 325 mg via ORAL
  Filled 2018-07-24: qty 1

## 2018-07-24 MED ORDER — PHENYLEPHRINE HCL 10 MG/ML IJ SOLN
INTRAMUSCULAR | Status: AC
  Filled 2018-07-24: qty 1

## 2018-07-24 MED ORDER — ASPIRIN 81 MG PO CHEW: 81.0000 mg | CHEWABLE_TABLET | Freq: Two times a day (BID) | ORAL | 0 refills | Status: AC

## 2018-07-24 MED ORDER — PROPOFOL 500 MG/50ML IV EMUL
INTRAVENOUS | Status: DC | PRN
  Administered 2018-07-24: 50 ug/kg/min via INTRAVENOUS

## 2018-07-24 MED ORDER — DOCUSATE SODIUM 100 MG PO CAPS
100.0000 mg | ORAL_CAPSULE | Freq: Two times a day (BID) | ORAL | Status: DC
  Administered 2018-07-24 – 2018-07-25 (×2): 100 mg via ORAL
  Filled 2018-07-24 (×2): qty 1

## 2018-07-24 MED ORDER — HYDROMORPHONE HCL 1 MG/ML IJ SOLN: 0.2500 mg | INTRAMUSCULAR | Status: DC | PRN

## 2018-07-24 MED ORDER — PROPOFOL 500 MG/50ML IV EMUL
INTRAVENOUS | Status: DC | PRN
  Administered 2018-07-24: 30 mg via INTRAVENOUS
  Administered 2018-07-24 (×2): 10 mg via INTRAVENOUS

## 2018-07-24 MED ORDER — PROMETHAZINE HCL 25 MG/ML IJ SOLN: 6.2500 mg | INTRAMUSCULAR | Status: DC | PRN

## 2018-07-24 MED ORDER — METHYLPREDNISOLONE ACETATE 80 MG/ML IJ SUSP
INTRAMUSCULAR | Status: DC | PRN
  Administered 2018-07-24: 80 mg

## 2018-07-24 MED ORDER — PROPOFOL 10 MG/ML IV BOLUS
INTRAVENOUS | Status: AC
  Filled 2018-07-24: qty 20

## 2018-07-24 MED ORDER — BUPIVACAINE-EPINEPHRINE (PF) 0.25% -1:200000 IJ SOLN
INTRAMUSCULAR | Status: AC
  Filled 2018-07-24: qty 30

## 2018-07-24 MED ORDER — LACTATED RINGERS IV SOLN
INTRAVENOUS | Status: DC
  Administered 2018-07-24 (×2): via INTRAVENOUS

## 2018-07-24 MED ORDER — FENTANYL CITRATE (PF) 100 MCG/2ML IJ SOLN
50.0000 ug | INTRAMUSCULAR | Status: DC
  Administered 2018-07-24: 50 ug via INTRAVENOUS

## 2018-07-24 MED ORDER — BUPIVACAINE IN DEXTROSE 0.75-8.25 % IT SOLN
INTRATHECAL | Status: DC | PRN
  Administered 2018-07-24: 1.6 mL via INTRATHECAL

## 2018-07-24 MED ORDER — EPHEDRINE SULFATE 50 MG/ML IJ SOLN
INTRAMUSCULAR | Status: DC | PRN
  Administered 2018-07-24 (×2): 5 mg via INTRAVENOUS

## 2018-07-24 MED ORDER — SODIUM CHLORIDE (PF) 0.9 % IJ SOLN
INTRAMUSCULAR | Status: AC
  Filled 2018-07-24: qty 50

## 2018-07-24 MED ORDER — ONDANSETRON HCL 4 MG PO TABS: 4.0000 mg | ORAL_TABLET | Freq: Four times a day (QID) | ORAL | Status: DC | PRN

## 2018-07-24 MED ORDER — ALUM & MAG HYDROXIDE-SIMETH 200-200-20 MG/5ML PO SUSP: 15.0000 mL | ORAL | Status: DC | PRN

## 2018-07-24 MED ORDER — HYDROCODONE-ACETAMINOPHEN 7.5-325 MG PO TABS: 1.0000 | ORAL_TABLET | ORAL | Status: DC | PRN

## 2018-07-24 MED ORDER — CLONIDINE HCL (ANALGESIA) 100 MCG/ML EP SOLN
EPIDURAL | Status: DC | PRN
  Administered 2018-07-24: 50 ug

## 2018-07-24 MED ORDER — ONDANSETRON HCL 4 MG/2ML IJ SOLN: 4.0000 mg | Freq: Four times a day (QID) | INTRAMUSCULAR | Status: DC | PRN

## 2018-07-24 MED ORDER — TRANEXAMIC ACID-NACL 1000-0.7 MG/100ML-% IV SOLN
1000.0000 mg | Freq: Once | INTRAVENOUS | Status: AC
  Administered 2018-07-24: 1000 mg via INTRAVENOUS
  Filled 2018-07-24: qty 100

## 2018-07-24 MED ORDER — SODIUM CHLORIDE 0.9 % IV SOLN
INTRAVENOUS | Status: DC
  Administered 2018-07-24 – 2018-07-25 (×2): via INTRAVENOUS

## 2018-07-24 MED ORDER — METHOCARBAMOL 500 MG IVPB - SIMPLE MED
500.0000 mg | Freq: Four times a day (QID) | INTRAVENOUS | Status: DC | PRN
  Filled 2018-07-24: qty 50

## 2018-07-24 MED ORDER — MENTHOL 3 MG MT LOZG: 1.0000 | LOZENGE | OROMUCOSAL | Status: DC | PRN

## 2018-07-24 MED ORDER — CELECOXIB 200 MG PO CAPS
200.0000 mg | ORAL_CAPSULE | Freq: Two times a day (BID) | ORAL | Status: DC
  Administered 2018-07-24 – 2018-07-25 (×2): 200 mg via ORAL
  Filled 2018-07-24 (×3): qty 1

## 2018-07-24 SURGICAL SUPPLY — 56 items
ATTUNE MED ANAT PAT 32 KNEE (Knees) ×3 IMPLANT
ATTUNE MED ANAT PAT 32MM KNEE (Knees) ×1 IMPLANT
ATTUNE PSFEM LTSZ4 NARCEM KNEE (Femur) ×4 IMPLANT
ATTUNE PSRP INSR SZ4 5 KNEE (Insert) ×3 IMPLANT
ATTUNE PSRP INSR SZ4 5MM KNEE (Insert) ×1 IMPLANT
BAG ZIPLOCK 12X15 (MISCELLANEOUS) IMPLANT
BANDAGE ACE 6X5 VEL STRL LF (GAUZE/BANDAGES/DRESSINGS) ×4 IMPLANT
BANDAGE ELASTIC 6 VELCRO ST LF (GAUZE/BANDAGES/DRESSINGS) ×4 IMPLANT
BASE TIBIAL ROT PLAT SZ 3 KNEE (Knees) ×2 IMPLANT
BLADE SAW SGTL 11.0X1.19X90.0M (BLADE) ×4 IMPLANT
BLADE SAW SGTL 13.0X1.19X90.0M (BLADE) ×4 IMPLANT
BOWL SMART MIX CTS (DISPOSABLE) ×4 IMPLANT
CEMENT HV SMART SET (Cement) ×8 IMPLANT
COVER SURGICAL LIGHT HANDLE (MISCELLANEOUS) ×4 IMPLANT
COVER WAND RF STERILE (DRAPES) IMPLANT
CUFF TOURN SGL QUICK 34 (TOURNIQUET CUFF) ×2
CUFF TRNQT CYL 34X4X40X1 (TOURNIQUET CUFF) ×2 IMPLANT
DECANTER SPIKE VIAL GLASS SM (MISCELLANEOUS) ×8 IMPLANT
DERMABOND ADVANCED (GAUZE/BANDAGES/DRESSINGS) ×2
DERMABOND ADVANCED .7 DNX12 (GAUZE/BANDAGES/DRESSINGS) ×2 IMPLANT
DRAPE U-SHAPE 47X51 STRL (DRAPES) ×4 IMPLANT
DRESSING AQUACEL AG SP 3.5X10 (GAUZE/BANDAGES/DRESSINGS) ×2 IMPLANT
DRSG AQUACEL AG SP 3.5X10 (GAUZE/BANDAGES/DRESSINGS) ×4
DURAPREP 26ML APPLICATOR (WOUND CARE) ×8 IMPLANT
ELECT REM PT RETURN 15FT ADLT (MISCELLANEOUS) ×4 IMPLANT
GLOVE BIOGEL M 7.0 STRL (GLOVE) IMPLANT
GLOVE BIOGEL PI IND STRL 7.5 (GLOVE) ×2 IMPLANT
GLOVE BIOGEL PI IND STRL 8.5 (GLOVE) ×2 IMPLANT
GLOVE BIOGEL PI INDICATOR 7.5 (GLOVE) ×2
GLOVE BIOGEL PI INDICATOR 8.5 (GLOVE) ×2
GLOVE ECLIPSE 8.0 STRL XLNG CF (GLOVE) ×4 IMPLANT
GLOVE ORTHO TXT STRL SZ7.5 (GLOVE) ×8 IMPLANT
GOWN STRL REUS W/TWL 2XL LVL3 (GOWN DISPOSABLE) ×4 IMPLANT
GOWN STRL REUS W/TWL LRG LVL3 (GOWN DISPOSABLE) ×4 IMPLANT
HANDPIECE INTERPULSE COAX TIP (DISPOSABLE) ×2
HOLDER FOLEY CATH W/STRAP (MISCELLANEOUS) IMPLANT
MANIFOLD NEPTUNE II (INSTRUMENTS) ×4 IMPLANT
NDL SAFETY ECLIPSE 18X1.5 (NEEDLE) IMPLANT
NEEDLE HYPO 18GX1.5 SHARP (NEEDLE)
PACK TOTAL KNEE CUSTOM (KITS) ×4 IMPLANT
PIN FIX SIGMA HP QUICK REL (PIN) ×4 IMPLANT
PIN THREADED HEADED SIGMA (PIN) ×4 IMPLANT
POSITIONER SURGICAL ARM (MISCELLANEOUS) ×4 IMPLANT
SET HNDPC FAN SPRY TIP SCT (DISPOSABLE) ×2 IMPLANT
SET PAD KNEE POSITIONER (MISCELLANEOUS) ×4 IMPLANT
SUT MNCRL AB 4-0 PS2 18 (SUTURE) ×4 IMPLANT
SUT STRATAFIX PDS+ 0 24IN (SUTURE) ×4 IMPLANT
SUT VIC AB 1 CT1 36 (SUTURE) ×4 IMPLANT
SUT VIC AB 2-0 CT1 27 (SUTURE) ×6
SUT VIC AB 2-0 CT1 TAPERPNT 27 (SUTURE) ×6 IMPLANT
SYRINGE 3CC LL L/F (MISCELLANEOUS) ×4 IMPLANT
TIBIAL BASE ROT PLAT SZ 3 KNEE (Knees) ×4 IMPLANT
TRAY FOLEY MTR SLVR 16FR STAT (SET/KITS/TRAYS/PACK) ×4 IMPLANT
WATER STERILE IRR 1000ML POUR (IV SOLUTION) ×4 IMPLANT
WRAP KNEE MAXI GEL POST OP (GAUZE/BANDAGES/DRESSINGS) ×4 IMPLANT
YANKAUER SUCT BULB TIP 10FT TU (MISCELLANEOUS) ×4 IMPLANT

## 2018-07-24 NOTE — Op Note (Signed)
NAME:  Faith Miles                      MEDICAL RECORD NO.:  [id number]                             FACILITY:  [facility]      PHYSICIAN:  Pietro Cassis. Alvan Dame, M.D.  DATE OF BIRTH:  11/25/1945      DATE OF PROCEDURE:  07/24/2018                                     OPERATIVE REPORT         PREOPERATIVE DIAGNOSIS:  Left knee osteoarthritis. 2. Right knee osteoarthritis and pain     POSTOPERATIVE DIAGNOSIS:  Left knee osteoarthritis. 2. Right knee osteoarthritis and pain     FINDINGS:  The patient was noted to have complete loss of cartilage and   bone-on-bone arthritis with associated osteophytes in the patellofemoral and lateral compartments of   the knee with a significant synovitis and associated effusion.  The patient had failed months of conservative treatment including medications, injection therapy, activity modification.     PROCEDURE:  Left total knee replacement. 2. Right knee intra-articular injection (80mg  of Depomedrol and 6 cc of .25% marcaine)     COMPONENTS USED:  DePuy Attune rotating platform posterior stabilized knee   system, a size 4N femur, 3 tibia, size 5 mm PS AOX insert, and 32 anatomic patellar   button.      SURGEON:  Pietro Cassis. Alvan Dame, M.D.      ASSISTANT:  Danae Orleans, PA-C.      ANESTHESIA:  Regional and Spinal.      SPECIMENS:  None.      COMPLICATION:  None.      DRAINS:  None.  EBL: <100      TOURNIQUET TIME:   Total Tourniquet Time Documented: Thigh (Left) - 27 minutes Total: Thigh (Left) - 27 minutes  .      The patient was stable to the recovery room.      INDICATION FOR PROCEDURE:  Faith Miles is a 73 y.o. female patient of   mine.  The patient had been seen, evaluated, and treated for months conservatively in the   office with medication, activity modification, and injections.  The patient had   radiographic changes of bone-on-bone arthritis with endplate sclerosis and osteophytes noted.  Based on the radiographic changes and failed  conservative measures, the patient   decided to proceed with definitive treatment, total knee replacement.  Risks of infection, DVT, component failure, need for revision surgery, neurovascular injury were reviewed in the office setting.  The postop course was reviewed stressing the efforts to maximize post-operative satisfaction and function.  Consent was obtained for benefit of pain   relief.      PROCEDURE IN DETAIL:  The patient was brought to the operative theater.   Once adequate anesthesia, preoperative antibiotics, 2 gm of Ancef,1 gm of Tranexamic Acid, and 10 mg of Decadron administered, the patient was positioned supine with a left thigh tourniquet placed.  The  left lower extremity was prepped and draped in sterile fashion.  A time-   out was performed identifying the patient, planned procedure, and the appropriate extremity.      The left lower extremity was placed in the  DeMayo leg holder.  The leg was   exsanguinated, tourniquet elevated to 250 mmHg.  A midline incision was   made followed by median parapatellar arthrotomy.  Following initial   exposure, attention was first directed to the patella.  Precut   measurement was noted to be 20 mm with significant degenerative changes.  I resected down to 14 mm and used a   32 anatomic patellar button to restore patellar height as well as cover the cut surface.      The lug holes were drilled and a metal shim was placed to protect the   patella from retractors and saw blade during the procedure.      At this point, attention was now directed to the femur.  The femoral   canal was opened with a drill, irrigated to try to prevent fat emboli.  An   intramedullary rod was passed at 3 degrees valgus, 9 mm of bone was   resected off the distal femur.  Following this resection, the tibia was   subluxated anteriorly.  Using the extramedullary guide, 4 mm of bone was resected off   the proximal lateral tibia.  We confirmed the gap would be    stable medially and laterally with a size 5 spacer block as well as confirmed that the tibial cut was perpendicular in the coronal plane, checking with an alignment rod.      Once this was done, I sized the femur to be a size 4 in the anterior-   posterior dimension, chose a narrow component based on medial and   lateral dimension.  The size 4 rotation block was then pinned in   position anterior referenced using the C-clamp to set rotation.  The   anterior, posterior, and  chamfer cuts were made without difficulty nor   notching making certain that I was along the anterior cortex to help   with flexion gap stability.      The final box cut was made off the lateral aspect of distal femur.      At this point, the tibia was sized to be a size 3.  The size 3 tray was   then pinned in position through the medial third of the tubercle,   drilled, and keel punched.  Trial reduction was now carried with a 4 femur,  4 tibia, a size 5 mm PS insert, and the 32 anatomic patella botton.  The knee was brought to full extension with good flexion stability with the patella   tracking through the trochlea without application of pressure.  Given   all these findings the trial components removed.  Final components were   opened and cement was mixed.  The knee was irrigated with normal saline solution and pulse lavage.  The synovial lining was   then injected with 30 cc of 0.25% Marcaine with epinephrine, 1 cc of Toradol and 30 cc of NS for a total of 61 cc.     Final implants were then cemented onto cleaned and dried cut surfaces of bone with the knee brought to extension with a size 5 mm PS trial insert.      Once the cement had fully cured, excess cement was removed   throughout the knee.  I confirmed that I was satisfied with the range of   motion and stability, and the final size 5 mm PS AOX insert was chosen.  It was   placed into the knee.      The  tourniquet had been let down at 27 minutes.  No  significant   hemostasis was required.  The extensor mechanism was then reapproximated using #1 Vicryl and #1 Stratafix sutures with the knee   in flexion.  The   remaining wound was closed with 2-0 Vicryl and running 4-0 Monocryl.   The knee was cleaned, dried, dressed sterilely using Dermabond and   Aquacel dressing.    After the left knee was dressed the right knee was cleaned and prepped with betadine swab then injected after 2nd timeout with Depomedrol and Marcaine.  Bandaid applied.  The patient was then   brought to recovery room in stable condition, tolerating the procedure   well.   Please note that Physician Assistant, Danae Orleans, PA-C was present for the entirety of the case, and was utilized for pre-operative positioning, peri-operative retractor management, general facilitation of the procedure and for primary wound closure at the end of the case.              Pietro Cassis Alvan Dame, M.D.    07/24/2018 10:10 AM

## 2018-07-24 NOTE — Anesthesia Preprocedure Evaluation (Signed)
Anesthesia Evaluation  Patient identified by MRN, date of birth, ID band Patient awake    Reviewed: Allergy & Precautions, NPO status , Patient's Chart, lab work & pertinent test results  History of Anesthesia Complications (+) PONV and history of anesthetic complications  Airway Mallampati: II  TM Distance: >3 FB Neck ROM: Full    Dental  (+) Dental Advisory Given   Pulmonary neg pulmonary ROS, former smoker,    Pulmonary exam normal breath sounds clear to auscultation       Cardiovascular negative cardio ROS Normal cardiovascular exam Rhythm:Regular Rate:Normal     Neuro/Psych negative neurological ROS  negative psych ROS   GI/Hepatic negative GI ROS, Neg liver ROS,   Endo/Other  negative endocrine ROS  Renal/GU negative Renal ROS     Musculoskeletal  (+) Arthritis ,   Abdominal   Peds  Hematology negative hematology ROS (+)   Anesthesia Other Findings   Reproductive/Obstetrics negative OB ROS                             Anesthesia Physical Anesthesia Plan  ASA: II  Anesthesia Plan: Spinal   Post-op Pain Management:  Regional for Post-op pain   Induction: Intravenous  PONV Risk Score and Plan: 4 or greater and Ondansetron, Dexamethasone, Propofol infusion and Treatment may vary due to age or medical condition  Airway Management Planned: Simple Face Mask  Additional Equipment:   Intra-op Plan:   Post-operative Plan:   Informed Consent: I have reviewed the patients History and Physical, chart, labs and discussed the procedure including the risks, benefits and alternatives for the proposed anesthesia with the patient or authorized representative who has indicated his/her understanding and acceptance.   Dental advisory given  Plan Discussed with: CRNA  Anesthesia Plan Comments:         Anesthesia Quick Evaluation

## 2018-07-24 NOTE — Evaluation (Addendum)
Physical Therapy Evaluation Patient Details Name: Faith Miles MRN: [id number] DOB: 02/23/1946 Today's Date: 07/24/2018   History of Present Illness  72 yo feamle s/p L TKR and R knee injection on 07/24/18. PMH includes osteopenia, anxiety, TMJ, HLD, hearing loss, cataracts extraction.   Clinical Impression  Pt presents with mild L knee pain, increased time and effort to perform mobility tasks, and decreased tolerance for ambulation. Pt to benefit from acute PT to address deficits. Pt ambulated 25 ft with min guard to min assist, given pt with initial mild L knee buckle upon taking first step. Pt corrected by decreasing WB on operative side, not a problem for the duration of ambulation. Pt educated on quad sets (5-10/hour), ankle pumps (20/hour), and heel slides (5-10/hour) to perform this afternoon/evening to lessen stiffness and increase circulation, to pt's tolerance and limited by pain. PT to progress mobility as tolerated, and will continue to follow acutely.      Follow Up Recommendations Follow surgeon's recommendation for DC plan and follow-up therapies;Supervision for mobility/OOB(OPPT, scheduled for monday )    Equipment Recommendations  None recommended by PT    Recommendations for Other Services       Precautions / Restrictions Precautions Precautions: Fall Restrictions Weight Bearing Restrictions: No Other Position/Activity Restrictions: WBAT       Mobility  Bed Mobility Overal bed mobility: Needs Assistance Bed Mobility: Supine to Sit     Supine to sit: Min guard;HOB elevated     General bed mobility comments: Min guard for safety. Verbal cuing for scooting to EOB. Increased time/effort.   Transfers Overall transfer level: Needs assistance Equipment used: Rolling walker (2 wheeled) Transfers: Sit to/from Stand Sit to Stand: Min guard;From elevated surface         General transfer comment: Min guard for safety. Verbal cuing for hand placement. Pt steady  upon standing.   Ambulation/Gait Ambulation/Gait assistance: Min guard;Min assist Gait Distance (Feet): 25 Feet Assistive device: Rolling walker (2 wheeled) Gait Pattern/deviations: Step-to pattern;Decreased stride length;Decreased stance time - left;Decreased weight shift to left Gait velocity: decr    General Gait Details: Min guard for safety. Pt with mild L knee buckling upon first step, pt reporting too much weight on LLE. Pt self-corrected by decreasing weight shift to LLE. PT guarding with min assist for knee extension, but pt with less buckling after initial buckle. Pt had 2 buckles with getting to recliner, pt-self corrected and pt was stable.   Stairs            Wheelchair Mobility    Modified Rankin (Stroke Patients Only)       Balance Overall balance assessment: Mild deficits observed, not formally tested                                           Pertinent Vitals/Pain Pain Assessment: 0-10 Pain Score: 1  Pain Location: L knee Pain Descriptors / Indicators: Discomfort Pain Intervention(s): Limited activity within patient's tolerance;Repositioned;Ice applied;Monitored during session    Home Living Family/patient expects to be discharged to:: Private residence Living Arrangements: Alone Available Help at Discharge: Available 24 hours/day;Family(niece and sister will be staying with pt on rotation) Type of Home: Other(Comment)(condo) Home Access: Stairs to enter Entrance Stairs-Rails: None Entrance Stairs-Number of Steps: 1 Home Layout: Two level;Able to live on main level with bedroom/bathroom Home Equipment: Toilet riser;Walker - 2 wheels  Prior Function Level of Independence: Independent               Hand Dominance   Dominant Hand: Right    Extremity/Trunk Assessment   Upper Extremity Assessment Upper Extremity Assessment: Overall WFL for tasks assessed    Lower Extremity Assessment Lower Extremity Assessment:  Overall WFL for tasks assessed;LLE deficits/detail LLE Deficits / Details: suspected post-surgical weakness; able to perform ankle pumps, quad sets, heel slides, SLR  LLE Sensation: WNL    Cervical / Trunk Assessment Cervical / Trunk Assessment: Normal  Communication   Communication: No difficulties  Cognition Arousal/Alertness: Awake/alert Behavior During Therapy: WFL for tasks assessed/performed Overall Cognitive Status: Within Functional Limits for tasks assessed                                        General Comments      Exercises Total Joint Exercises Ankle Circles/Pumps: AROM;Both;10 reps;Seated Goniometric ROM: knee AAROM approximately 5-80*, limited by pain    Assessment/Plan    PT Assessment Patient needs continued PT services  PT Problem List Decreased strength;Pain;Decreased range of motion;Decreased activity tolerance;Decreased knowledge of use of DME;Decreased balance;Decreased safety awareness;Decreased mobility       PT Treatment Interventions DME instruction;Therapeutic activities;Gait training;Therapeutic exercise;Patient/family education;Balance training;Stair training;Functional mobility training    PT Goals (Current goals can be found in the Care Plan section)  Acute Rehab PT Goals Patient Stated Goal: none stated  PT Goal Formulation: With patient/family Time For Goal Achievement: 07/31/18 Potential to Achieve Goals: Good    Frequency 7X/week   Barriers to discharge        Co-evaluation               AM-PAC PT "6 Clicks" Daily Activity  Outcome Measure Difficulty turning over in bed (including adjusting bedclothes, sheets and blankets)?: Unable Difficulty moving from lying on back to sitting on the side of the bed? : Unable Difficulty sitting down on and standing up from a chair with arms (e.g., wheelchair, bedside commode, etc,.)?: Unable Help needed moving to and from a bed to chair (including a wheelchair)?: A  Little Help needed walking in hospital room?: A Little Help needed climbing 3-5 steps with a railing? : A Little 6 Click Score: 12    End of Session Equipment Utilized During Treatment: Gait belt Activity Tolerance: Patient tolerated treatment well Patient left: in chair;with chair alarm set;with call bell/phone within reach;with family/visitor present;with SCD's reapplied Nurse Communication: Mobility status PT Visit Diagnosis: Other abnormalities of gait and mobility (R26.89);Difficulty in walking, not elsewhere classified (R26.2)    Time: 6811-5726 PT Time Calculation (min) (ACUTE ONLY): 33 min   Charges:   PT Evaluation $PT Eval Low Complexity: 1 Low PT Treatments $Gait Training: 8-22 mins        Julien Girt, PT Acute Rehabilitation Services Pager [phone]  Office [phone]   Roxine Caddy D Elonda Husky 07/24/2018, 6:28 PM

## 2018-07-24 NOTE — Transfer of Care (Signed)
Immediate Anesthesia Transfer of Care Note  Patient: Faith Miles  Procedure(s) Performed: TOTAL KNEE ARTHROPLASTY (Left ) RIGHT KNEE INJECTION (Right )  Patient Location: PACU  Anesthesia Type:MAC and Spinal  Level of Consciousness: awake, alert  and oriented  Airway & Oxygen Therapy: Patient Spontanous Breathing and Patient connected to nasal cannula oxygen  Post-op Assessment: Report given to RN and Post -op Vital signs reviewed and stable  Post vital signs: Reviewed and stable  Last Vitals:  Vitals Value Taken Time  BP 110/63 07/24/2018 10:31 AM  Temp    Pulse 71 07/24/2018 10:32 AM  Resp 15 07/24/2018 10:32 AM  SpO2 98 % 07/24/2018 10:32 AM  Vitals shown include unvalidated device data.  Last Pain:  Vitals:   07/24/18 0629  TempSrc: Oral      Patients Stated Pain Goal: 4 (69/45/03 8882)  Complications: No apparent anesthesia complications

## 2018-07-24 NOTE — Anesthesia Procedure Notes (Signed)
Date/Time: 07/24/2018 8:36 AM Performed by: Glory Buff, CRNA Oxygen Delivery Method: Nasal cannula

## 2018-07-24 NOTE — Progress Notes (Signed)
AssistedDr. Lissa Hoard with left, ultrasound guided, adductor canal block. Side rails up, monitors on throughout procedure. See vital signs in flow sheet. Tolerated Procedure well.

## 2018-07-24 NOTE — Interval H&P Note (Signed)
History and Physical Interval Note:  07/24/2018 7:04 AM  Faith Miles  has presented today for surgery, with the diagnosis of Left knee osteoarthritis  The various methods of treatment have been discussed with the patient and family. After consideration of risks, benefits and other options for treatment, the patient has consented to  Procedure(s) with comments: TOTAL KNEE ARTHROPLASTY (Left) - 70 mins RIGHT KNEE INJECTION (Right) as a surgical intervention .  The patient's history has been reviewed, patient examined, no change in status, stable for surgery.  I have reviewed the patient's chart and labs.  Questions were answered to the patient's satisfaction.     Mauri Pole

## 2018-07-24 NOTE — Discharge Instructions (Signed)

## 2018-07-24 NOTE — Anesthesia Procedure Notes (Signed)
Spinal  Patient location during procedure: OR Start time: 07/24/2018 8:40 AM End time: 07/24/2018 8:44 AM Staffing Anesthesiologist: Nolon Nations, MD Resident/CRNA: Glory Buff, CRNA Performed: resident/CRNA  Preanesthetic Checklist Completed: patient identified, site marked, surgical consent, pre-op evaluation, timeout performed, IV checked, risks and benefits discussed and monitors and equipment checked Spinal Block Patient position: sitting Prep: DuraPrep Patient monitoring: heart rate, continuous pulse ox and blood pressure Approach: midline Location: L3-4 Injection technique: single-shot Needle Needle type: Pencan  Needle gauge: 24 G Needle length: 9 cm Needle insertion depth: 5 cm Assessment Sensory level: T6 Additional Notes Kit date checked and verified.  Sterile prep and drape, skin local with 1% xylocaine, - heme, - paraesthesia, +CSF, patient tolerated well.

## 2018-07-25 ENCOUNTER — Encounter (HOSPITAL_COMMUNITY): Payer: Self-pay | Admitting: Orthopedic Surgery

## 2018-07-25 DIAGNOSIS — M170 Bilateral primary osteoarthritis of knee: Secondary | ICD-10-CM | POA: Diagnosis present

## 2018-07-25 DIAGNOSIS — Z888 Allergy status to other drugs, medicaments and biological substances status: Secondary | ICD-10-CM | POA: Diagnosis not present

## 2018-07-25 DIAGNOSIS — H9190 Unspecified hearing loss, unspecified ear: Secondary | ICD-10-CM | POA: Diagnosis present

## 2018-07-25 DIAGNOSIS — H409 Unspecified glaucoma: Secondary | ICD-10-CM | POA: Diagnosis present

## 2018-07-25 DIAGNOSIS — Z87891 Personal history of nicotine dependence: Secondary | ICD-10-CM | POA: Diagnosis not present

## 2018-07-25 DIAGNOSIS — M659 Synovitis and tenosynovitis, unspecified: Secondary | ICD-10-CM | POA: Diagnosis present

## 2018-07-25 DIAGNOSIS — E785 Hyperlipidemia, unspecified: Secondary | ICD-10-CM | POA: Diagnosis present

## 2018-07-25 DIAGNOSIS — Z885 Allergy status to narcotic agent status: Secondary | ICD-10-CM | POA: Diagnosis not present

## 2018-07-25 DIAGNOSIS — M8580 Other specified disorders of bone density and structure, unspecified site: Secondary | ICD-10-CM | POA: Diagnosis present

## 2018-07-25 DIAGNOSIS — Z79899 Other long term (current) drug therapy: Secondary | ICD-10-CM | POA: Diagnosis not present

## 2018-07-25 DIAGNOSIS — F419 Anxiety disorder, unspecified: Secondary | ICD-10-CM | POA: Diagnosis present

## 2018-07-25 DIAGNOSIS — E663 Overweight: Secondary | ICD-10-CM | POA: Diagnosis present

## 2018-07-25 DIAGNOSIS — M1712 Unilateral primary osteoarthritis, left knee: Secondary | ICD-10-CM | POA: Diagnosis present

## 2018-07-25 DIAGNOSIS — Z85828 Personal history of other malignant neoplasm of skin: Secondary | ICD-10-CM | POA: Diagnosis not present

## 2018-07-25 LAB — CBC
HCT: 31.6 % — ABNORMAL LOW (ref 36.0–46.0)
HEMOGLOBIN: 10.3 g/dL — AB (ref 12.0–15.0)
MCH: 31.6 pg (ref 26.0–34.0)
MCHC: 32.6 g/dL (ref 30.0–36.0)
MCV: 96.9 fL (ref 80.0–100.0)
Platelets: 228 10*3/uL (ref 150–400)
RBC: 3.26 MIL/uL — ABNORMAL LOW (ref 3.87–5.11)
RDW: 13.2 % (ref 11.5–15.5)
WBC: 11.8 10*3/uL — AB (ref 4.0–10.5)
nRBC: 0 % (ref 0.0–0.2)

## 2018-07-25 LAB — BASIC METABOLIC PANEL
Anion gap: 7 (ref 5–15)
BUN: 15 mg/dL (ref 8–23)
CALCIUM: 8.1 mg/dL — AB (ref 8.9–10.3)
CO2: 24 mmol/L (ref 22–32)
CREATININE: 0.67 mg/dL (ref 0.44–1.00)
Chloride: 109 mmol/L (ref 98–111)
GFR calc non Af Amer: >60 mL/min (ref >60)
Glucose, Bld: 138 mg/dL — ABNORMAL HIGH (ref 70–99)
Potassium: 4.3 mmol/L (ref 3.5–5.1)
SODIUM: 140 mmol/L (ref 135–145)

## 2018-07-25 NOTE — Progress Notes (Signed)
Physical Therapy Treatment Patient Details Name: Faith Miles MRN: [id number] DOB: [birth year] Today's Date: 07/25/2018    History of Present Illness 72 yo feamle s/p L TKR and R knee injection on 07/24/18. PMH includes osteopenia, anxiety, TMJ, HLD, hearing loss, cataracts extraction.     PT Comments    POD # 1 pm session Assisted with amb a second time and completed remaining TE's on HEP handout. Pt ready for D/C to home with family support.   Follow Up Recommendations  Follow surgeon's recommendation for DC plan and follow-up therapies;Supervision for mobility/OOB     Equipment Recommendations  None recommended by PT    Recommendations for Other Services       Precautions / Restrictions Precautions Precautions: Fall Restrictions Weight Bearing Restrictions: No Other Position/Activity Restrictions: WBAT     Mobility  Bed Mobility               General bed mobility comments: OOB in recliner   Transfers Overall transfer level: Needs assistance Equipment used: Rolling walker (2 wheeled) Transfers: Sit to/from Stand Sit to Stand: Min guard;From elevated surface         General transfer comment: Min guard for safety. Verbal cuing for hand placement. Pt steady upon standing.   Ambulation/Gait Ambulation/Gait assistance: Supervision;Min guard Gait Distance (Feet): 100 Feet Assistive device: Rolling walker (2 wheeled) Gait Pattern/deviations: Step-to pattern;Decreased stride length;Decreased stance time - left;Decreased weight shift to left Gait velocity: decr    General Gait Details: <25% VC's on safety with turns and to decrease gait speed to increase safety.     Stairs Stairs: Yes Stairs assistance: Supervision;Min guard Stair Management: No rails;Step to pattern;Sideways;Forwards Number of Stairs: 4 General stair comments: first practiced one step using walker then practiced multiple steps using one left rail with 25% VC's on proper sequencing and  safety.   Wheelchair Mobility    Modified Rankin (Stroke Patients Only)       Balance                                            Cognition Arousal/Alertness: Awake/alert Behavior During Therapy: WFL for tasks assessed/performed Overall Cognitive Status: Within Functional Limits for tasks assessed                                        Exercises  10 reps knee bends, assisted knee bends and LAQ's    General Comments        Pertinent Vitals/Pain Pain Assessment: 0-10 Pain Score: 5  Pain Location: L knee Pain Descriptors / Indicators: Discomfort;Operative site guarding Pain Intervention(s): Monitored during session;Premedicated before session;Repositioned;Ice applied    Home Living                      Prior Function            PT Goals (current goals can now be found in the care plan section) Progress towards PT goals: Progressing toward goals    Frequency    7X/week      PT Plan Current plan remains appropriate    Co-evaluation              AM-PAC PT "6 Clicks" Daily Activity  Outcome Measure  Difficulty turning over in bed (including adjusting  bedclothes, sheets and blankets)?: A Lot Difficulty moving from lying on back to sitting on the side of the bed? : A Lot Difficulty sitting down on and standing up from a chair with arms (e.g., wheelchair, bedside commode, etc,.)?: A Lot Help needed moving to and from a bed to chair (including a wheelchair)?: A Lot Help needed walking in hospital room?: A Lot Help needed climbing 3-5 steps with a railing? : A Lot 6 Click Score: 12    End of Session Equipment Utilized During Treatment: Gait belt Activity Tolerance: Patient tolerated treatment well Patient left: in chair;with chair alarm set;with call bell/phone within reach;with family/visitor present;with SCD's reapplied Nurse Communication: Mobility status PT Visit Diagnosis: Other abnormalities of gait and  mobility (R26.89);Difficulty in walking, not elsewhere classified (R26.2)     Time: 1110-1135 PT Time Calculation (min) (ACUTE ONLY): 25 min  Charges:  $Gait Training: 8-22 mins $Therapeutic Exercise: 8-22 mins                     Rica Koyanagi  PTA Acute  Rehabilitation Services Pager      [phone] Office      [phone]

## 2018-07-25 NOTE — Progress Notes (Signed)
     Subjective: 1 Day Post-Op Procedure(s) (LRB): TOTAL KNEE ARTHROPLASTY (Left) RIGHT KNEE INJECTION (Right)   Patient reports pain as mild, pain controlled. No events throughout the night. She is very happy with how she is doing at this point. Ready to be discharged home.   Patient's anticipated LOS is less than 2 midnights, meeting these requirements: - Lives within 1 hour of care - Has a competent adult at home to recover with post-op recover - NO history of  - Chronic pain requiring opiods  - Diabetes  - Coronary Artery Disease  - Heart failure  - Heart attack  - Stroke  - DVT/VTE  - Cardiac arrhythmia  - Respiratory Failure/COPD  - Renal failure  - Anemia  - Advanced Liver disease       Objective:   VITALS:   Vitals:   07/24/18 2141 07/25/18 0142  BP: (!) 103/55 (!) 100/50  Pulse: 68 72  Resp: 17 14  Temp: 99.2 F (37.3 C) 98.2 F (36.8 C)  SpO2: 96% 95%    Dorsiflexion/Plantar flexion intact Incision: dressing C/D/I No cellulitis present Compartment soft  LABS Recent Labs    07/25/18 0442  HGB 10.3*  HCT 31.6*  WBC 11.8*  PLT 228    Recent Labs    07/25/18 0442  NA 140  K 4.3  BUN 15  CREATININE 0.67  GLUCOSE 138*     Assessment/Plan: 1 Day Post-Op Procedure(s) (LRB): TOTAL KNEE ARTHROPLASTY (Left) RIGHT KNEE INJECTION (Right) Foley cath d/c'ed Advance diet Up with therapy D/C IV fluids Discharge home Follow up in 2 weeks at [facility] ([place]).  Overweight (BMI 25-29.9) Estimated body mass index is 25.47 kg/m as calculated from the following:   Height as of this encounter: 5\' 4"  (1.626 m).   Weight as of this encounter: 67.3 kg. Patient also counseled that weight may inhibit the healing process Patient counseled that losing weight will help with future health issues       [place]. Satya Bohall   PAC  07/25/2018, 8:09 AM

## 2018-07-25 NOTE — Progress Notes (Signed)
Physical Therapy Treatment Patient Details Name: Faith Miles MRN: [id number] DOB: 1945/12/16 Today's Date: 07/25/2018    History of Present Illness 72 yo feamle s/p L TKR and R knee injection on 07/24/18. PMH includes osteopenia, anxiety, TMJ, HLD, hearing loss, cataracts extraction.     PT Comments    POD # 1 am session Assisted with amb a greater distance, practiced stairs then returned to room to Perform some TE's following HEP handout.  Instructed on proper tech, freq as well as use of ICE.      Follow Up Recommendations  Follow surgeon's recommendation for DC plan and follow-up therapies;Supervision for mobility/OOB     Equipment Recommendations  None recommended by PT    Recommendations for Other Services       Precautions / Restrictions Precautions Precautions: Fall Restrictions Weight Bearing Restrictions: No Other Position/Activity Restrictions: WBAT     Mobility  Bed Mobility               General bed mobility comments: OOB in recliner   Transfers Overall transfer level: Needs assistance Equipment used: Rolling walker (2 wheeled) Transfers: Sit to/from Stand Sit to Stand: Min guard;From elevated surface         General transfer comment: Min guard for safety. Verbal cuing for hand placement. Pt steady upon standing.   Ambulation/Gait Ambulation/Gait assistance: Supervision;Min guard Gait Distance (Feet): 115 Feet Assistive device: Rolling walker (2 wheeled) Gait Pattern/deviations: Step-to pattern;Decreased stride length;Decreased stance time - left;Decreased weight shift to left Gait velocity: decr    General Gait Details: <25% VC's on safety with turns and to decrease gait speed to increase safety.     Stairs Stairs: Yes Stairs assistance: Supervision;Min guard Stair Management: No rails;Step to pattern;Sideways;Forwards Number of Stairs: 4 General stair comments: first practiced one step using walker then practiced multiple steps  using one left rail with 25% VC's on proper sequencing and safety.   Wheelchair Mobility    Modified Rankin (Stroke Patients Only)       Balance                                            Cognition Arousal/Alertness: Awake/alert Behavior During Therapy: WFL for tasks assessed/performed Overall Cognitive Status: Within Functional Limits for tasks assessed                                        Exercises   Total Knee Replacement TE's 10 reps B LE ankle pumps 10 reps towel squeezes 10 reps knee presses 10 reps heel slides   Followed by ICE     General Comments        Pertinent Vitals/Pain Pain Assessment: 0-10 Pain Score: 5  Pain Location: L knee Pain Descriptors / Indicators: Discomfort;Operative site guarding Pain Intervention(s): Monitored during session;Premedicated before session;Repositioned;Ice applied    Home Living                      Prior Function            PT Goals (current goals can now be found in the care plan section) Progress towards PT goals: Progressing toward goals    Frequency    7X/week      PT Plan Current plan remains appropriate  Co-evaluation              AM-PAC PT "6 Clicks" Daily Activity  Outcome Measure  Difficulty turning over in bed (including adjusting bedclothes, sheets and blankets)?: A Lot Difficulty moving from lying on back to sitting on the side of the bed? : A Lot Difficulty sitting down on and standing up from a chair with arms (e.g., wheelchair, bedside commode, etc,.)?: A Lot Help needed moving to and from a bed to chair (including a wheelchair)?: A Lot Help needed walking in hospital room?: A Lot Help needed climbing 3-5 steps with a railing? : A Lot 6 Click Score: 12    End of Session Equipment Utilized During Treatment: Gait belt Activity Tolerance: Patient tolerated treatment well Patient left: in chair;with chair alarm set;with call bell/phone  within reach;with family/visitor present;with SCD's reapplied Nurse Communication: Mobility status PT Visit Diagnosis: Other abnormalities of gait and mobility (R26.89);Difficulty in walking, not elsewhere classified (R26.2)     Time: 3817-7116 PT Time Calculation (min) (ACUTE ONLY): 30 min  Charges:  $Gait Training: 8-22 mins $Therapeutic Activity: 8-22 mins                     Rica Koyanagi  PTA Acute  Rehabilitation Services Pager      [phone] Office      [phone]

## 2018-07-25 NOTE — Progress Notes (Signed)
Reviewed patients discharge education, instructions and medications. IV removed. Patient and family verbalize no further questions.

## 2018-07-25 NOTE — Anesthesia Postprocedure Evaluation (Signed)
Anesthesia Post Note  Patient: Faith Miles  Procedure(s) Performed: TOTAL KNEE ARTHROPLASTY (Left ) RIGHT KNEE INJECTION (Right )     Patient location during evaluation: PACU Anesthesia Type: Spinal Level of consciousness: awake and alert Pain management: pain level controlled Vital Signs Assessment: post-procedure vital signs reviewed and stable Respiratory status: spontaneous breathing Cardiovascular status: stable Anesthetic complications: no    Last Vitals:  Vitals:   07/25/18 0142 07/25/18 1102  BP: (!) 100/50 (!) 123/58  Pulse: 72 68  Resp: 14 15  Temp: 36.8 C 36.7 C  SpO2: 95% 97%    Last Pain:  Vitals:   07/25/18 1316  TempSrc:   PainSc: [place]

## 2018-07-28 DIAGNOSIS — M25662 Stiffness of left knee, not elsewhere classified: Secondary | ICD-10-CM | POA: Diagnosis not present

## 2018-07-30 DIAGNOSIS — M25662 Stiffness of left knee, not elsewhere classified: Secondary | ICD-10-CM | POA: Diagnosis not present

## 2018-07-30 NOTE — Discharge Summary (Signed)
Physician Discharge Summary  Patient ID: BRYANA FROEMMING MRN: [id number] DOB/AGE: 1946-02-20 72 y.o.  Admit date: 07/24/2018 Discharge date: 07/25/2018   Procedures:  Procedure(s) (LRB): TOTAL KNEE ARTHROPLASTY (Left) RIGHT KNEE INJECTION (Right)  Attending Physician:  Dr. Paralee Cancel   Admission Diagnoses:   Right knee primary OA / pain  Discharge Diagnoses:  Principal Problem:   S/P left TKA Active Problems:   Overweight (BMI 25.0-29.9)  Past Medical History:  Diagnosis Date  . Arthritis   . Cataract    surgery  . Complication of anesthesia   . Glaucoma   . History of Bell's palsy   . Insomnia   . Osteopenia   . PONV (postoperative nausea and vomiting)     HPI:    Faith Miles, 72 y.o. female, has a history of pain and functional disability in the left knee due to arthritis and has failed non-surgical conservative treatments for greater than 12 weeks to include NSAID's and/or analgesics, corticosteriod injections, viscosupplementation injections and activity modification.  Onset of symptoms was gradual, starting 2+ years ago with gradually worsening course since that time. The patient noted no past surgery on the left knee(s).  Patient currently rates pain in the left knee(s) at 7 out of 10 with activity. Patient has worsening of pain with activity and weight bearing, pain that interferes with activities of daily living, pain with passive range of motion, crepitus and joint swelling.  Patient has evidence of periarticular osteophytes and joint space narrowing by imaging studies.  There is no active infection.  Risks, benefits and expectations were discussed with the patient.  Risks including but not limited to the risk of anesthesia, blood clots, nerve damage, blood vessel damage, failure of the prosthesis, infection and up to and including death.  Patient understand the risks, benefits and expectations and wishes to proceed with surgery.   PCP: Elby Showers, MD    Discharged Condition: good  Hospital Course:  Patient underwent the above stated procedure on 07/24/2018. Patient tolerated the procedure well and brought to the recovery room in good condition and subsequently to the floor.  POD #1 BP: 100/50 ; Pulse: 72 ; Temp: 98.2 F (36.8 C) ; Resp: 14 Patient reports pain as mild, pain controlled. No events throughout the night. She is very happy with how she is doing at this point. Ready to be discharged home. Dorsiflexion/plantar flexion intact, incision: dressing C/D/I, no cellulitis present and compartment soft.   LABS  Basename    HGB     10.3  HCT     31.6    Discharge Exam: General appearance: alert, cooperative and no distress Extremities: Homans sign is negative, no sign of DVT, no edema, redness or tenderness in the calves or thighs and no ulcers, gangrene or trophic changes  Disposition:  Home with follow up in 2 weeks   Follow-up Information    Paralee Cancel, MD. Schedule an appointment as soon as possible for a visit in 2 weeks.   Specialty:  Orthopedic Surgery Contact information: [address] [place] [address] [phone]           Discharge Instructions    Call MD / Call 911   Complete by:  As directed    If you experience chest pain or shortness of breath, CALL 911 and be transported to the hospital emergency room.  If you develope a fever above 101 F, pus (white drainage) or increased drainage or redness at the wound,  or calf pain, call your surgeon's office.   Change dressing   Complete by:  As directed    Maintain surgical dressing until follow up in the clinic. If the edges start to pull up, may reinforce with tape. If the dressing is no longer working, may remove and cover with gauze and tape, but must keep the area dry and clean.  Call with any questions or concerns.   Constipation Prevention   Complete by:  As directed    Drink plenty of fluids.  Prune juice may be helpful.  You may  use a stool softener, such as Colace (over the counter) 100 mg twice a day.  Use MiraLax (over the counter) for constipation as needed.   Diet - low sodium heart healthy   Complete by:  As directed    Discharge instructions   Complete by:  As directed    Maintain surgical dressing until follow up in the clinic. If the edges start to pull up, may reinforce with tape. If the dressing is no longer working, may remove and cover with gauze and tape, but must keep the area dry and clean.  Follow up in 2 weeks at [facility]. Call with any questions or concerns.   Increase activity slowly as tolerated   Complete by:  As directed    Weight bearing as tolerated with assist device (walker, cane, etc) as directed, use it as long as suggested by your surgeon or therapist, typically at least 4-6 weeks.   TED hose   Complete by:  As directed    Use stockings (TED hose) for 2 weeks on both leg(s).  You may remove them at night for sleeping.      Allergies as of 07/25/2018      Reactions   Amoxicillin-pot Clavulanate Nausea Only   Morphine Other (See Comments)   Unknown      Medication List    TAKE these medications   aspirin 81 MG chewable tablet Chew 1 tablet (81 mg total) by mouth 2 (two) times daily. Take for 4 weeks, then resume regular dose.   CENTRUM SILVER PO Take 1 tablet by mouth daily.   cholecalciferol 1000 units tablet Commonly known as:  VITAMIN D Take 1,000 Units by mouth daily.   cycloSPORINE 0.05 % ophthalmic emulsion Commonly known as:  RESTASIS Place 1 drop into both eyes 2 (two) times daily.   docusate sodium 100 MG capsule Commonly known as:  COLACE Take 1 capsule (100 mg total) by mouth 2 (two) times daily.   ferrous sulfate 325 (65 FE) MG tablet Take 1 tablet (325 mg total) by mouth 3 (three) times daily with meals.   FISH OIL PO Take 1 capsule by mouth daily.   FLUoxetine 10 MG capsule Commonly known as:  PROZAC TAKE 1 CAPSULE(10 MG) BY MOUTH  DAILY What changed:  See the new instructions.   HYDROcodone-acetaminophen 7.5-325 MG tablet Commonly known as:  NORCO Take 1-2 tablets by mouth every 4 (four) hours as needed for moderate pain.   HYDROcodone-acetaminophen 7.5-325 MG tablet Commonly known as:  NORCO Take 1-2 tablets by mouth every 4 (four) hours as needed for moderate pain.   latanoprost 0.005 % ophthalmic solution Commonly known as:  XALATAN Place 1 drop into both eyes at bedtime.   methocarbamol 500 MG tablet Commonly known as:  ROBAXIN Take 1 tablet (500 mg total) by mouth every 6 (six) hours as needed for muscle spasms.   polyethylene glycol packet Commonly known  as:  MIRALAX / GLYCOLAX Take 17 g by mouth 2 (two) times daily.            Discharge Care Instructions  (From admission, onward)         Start     Ordered   07/25/18 0000  Change dressing    Comments:  Maintain surgical dressing until follow up in the clinic. If the edges start to pull up, may reinforce with tape. If the dressing is no longer working, may remove and cover with gauze and tape, but must keep the area dry and clean.  Call with any questions or concerns.   07/25/18 1188           Signed: West Pugh. Rockwell Zentz   PA-C  07/30/2018, 9:41 AM

## 2018-08-01 DIAGNOSIS — M25662 Stiffness of left knee, not elsewhere classified: Secondary | ICD-10-CM | POA: Diagnosis not present

## 2018-08-04 DIAGNOSIS — M25662 Stiffness of left knee, not elsewhere classified: Secondary | ICD-10-CM | POA: Diagnosis not present

## 2018-08-06 DIAGNOSIS — M25662 Stiffness of left knee, not elsewhere classified: Secondary | ICD-10-CM | POA: Diagnosis not present

## 2018-08-11 DIAGNOSIS — M25662 Stiffness of left knee, not elsewhere classified: Secondary | ICD-10-CM | POA: Diagnosis not present

## 2018-08-13 DIAGNOSIS — M25662 Stiffness of left knee, not elsewhere classified: Secondary | ICD-10-CM | POA: Diagnosis not present

## 2018-08-15 DIAGNOSIS — M25662 Stiffness of left knee, not elsewhere classified: Secondary | ICD-10-CM | POA: Diagnosis not present

## 2018-08-18 DIAGNOSIS — M25662 Stiffness of left knee, not elsewhere classified: Secondary | ICD-10-CM | POA: Diagnosis not present

## 2018-08-20 DIAGNOSIS — Z4789 Encounter for other orthopedic aftercare: Secondary | ICD-10-CM | POA: Diagnosis not present

## 2018-08-20 DIAGNOSIS — M25562 Pain in left knee: Secondary | ICD-10-CM | POA: Diagnosis not present

## 2018-08-21 DIAGNOSIS — Z4789 Encounter for other orthopedic aftercare: Secondary | ICD-10-CM | POA: Diagnosis not present

## 2018-08-21 DIAGNOSIS — M25562 Pain in left knee: Secondary | ICD-10-CM | POA: Diagnosis not present

## 2018-08-22 DIAGNOSIS — Z4789 Encounter for other orthopedic aftercare: Secondary | ICD-10-CM | POA: Diagnosis not present

## 2018-08-22 DIAGNOSIS — M25562 Pain in left knee: Secondary | ICD-10-CM | POA: Diagnosis not present

## 2018-08-26 DIAGNOSIS — M25562 Pain in left knee: Secondary | ICD-10-CM | POA: Diagnosis not present

## 2018-08-26 DIAGNOSIS — Z4789 Encounter for other orthopedic aftercare: Secondary | ICD-10-CM | POA: Diagnosis not present

## 2018-08-27 DIAGNOSIS — M25562 Pain in left knee: Secondary | ICD-10-CM | POA: Diagnosis not present

## 2018-08-27 DIAGNOSIS — Z4789 Encounter for other orthopedic aftercare: Secondary | ICD-10-CM | POA: Diagnosis not present

## 2018-08-28 DIAGNOSIS — M25562 Pain in left knee: Secondary | ICD-10-CM | POA: Diagnosis not present

## 2018-08-28 DIAGNOSIS — Z4789 Encounter for other orthopedic aftercare: Secondary | ICD-10-CM | POA: Diagnosis not present

## 2018-08-29 DIAGNOSIS — M25562 Pain in left knee: Secondary | ICD-10-CM | POA: Diagnosis not present

## 2018-08-29 DIAGNOSIS — Z4789 Encounter for other orthopedic aftercare: Secondary | ICD-10-CM | POA: Diagnosis not present

## 2018-09-02 DIAGNOSIS — M25562 Pain in left knee: Secondary | ICD-10-CM | POA: Diagnosis not present

## 2018-09-02 DIAGNOSIS — Z4789 Encounter for other orthopedic aftercare: Secondary | ICD-10-CM | POA: Diagnosis not present

## 2018-09-04 DIAGNOSIS — M25562 Pain in left knee: Secondary | ICD-10-CM | POA: Diagnosis not present

## 2018-09-04 DIAGNOSIS — Z4789 Encounter for other orthopedic aftercare: Secondary | ICD-10-CM | POA: Diagnosis not present

## 2018-09-09 DIAGNOSIS — Z4789 Encounter for other orthopedic aftercare: Secondary | ICD-10-CM | POA: Diagnosis not present

## 2018-09-09 DIAGNOSIS — M25562 Pain in left knee: Secondary | ICD-10-CM | POA: Diagnosis not present

## 2018-09-12 DIAGNOSIS — M25562 Pain in left knee: Secondary | ICD-10-CM | POA: Diagnosis not present

## 2018-09-12 DIAGNOSIS — Z4789 Encounter for other orthopedic aftercare: Secondary | ICD-10-CM | POA: Diagnosis not present

## 2018-09-15 DIAGNOSIS — Z471 Aftercare following joint replacement surgery: Secondary | ICD-10-CM | POA: Diagnosis not present

## 2018-09-15 DIAGNOSIS — Z96652 Presence of left artificial knee joint: Secondary | ICD-10-CM | POA: Diagnosis not present

## 2018-09-17 DIAGNOSIS — M25662 Stiffness of left knee, not elsewhere classified: Secondary | ICD-10-CM | POA: Diagnosis not present

## 2018-09-19 DIAGNOSIS — M25562 Pain in left knee: Secondary | ICD-10-CM | POA: Diagnosis not present

## 2018-09-23 DIAGNOSIS — M25562 Pain in left knee: Secondary | ICD-10-CM | POA: Diagnosis not present

## 2018-09-26 DIAGNOSIS — M25562 Pain in left knee: Secondary | ICD-10-CM | POA: Diagnosis not present

## 2018-10-13 ENCOUNTER — Other Ambulatory Visit: Payer: Self-pay | Admitting: Internal Medicine

## 2018-10-13 DIAGNOSIS — F321 Major depressive disorder, single episode, moderate: Secondary | ICD-10-CM

## 2018-10-16 ENCOUNTER — Other Ambulatory Visit: Payer: Self-pay | Admitting: Internal Medicine

## 2018-10-16 NOTE — Telephone Encounter (Signed)
She said she is taking 20mg  daily.

## 2018-10-16 NOTE — Telephone Encounter (Signed)
I have called [facility] and cancelled prozac 10mg . Refilled prozac 20mg .

## 2018-10-16 NOTE — Telephone Encounter (Signed)
I just refilled the 10 mg dose. What is she taking?

## 2018-10-16 NOTE — Telephone Encounter (Signed)
Then please call the pharmacy and discontinue the 10 mg and refill the 20 mg for 6 months

## 2018-10-16 NOTE — Telephone Encounter (Signed)
Left message to call us back

## 2018-10-21 DIAGNOSIS — H401131 Primary open-angle glaucoma, bilateral, mild stage: Secondary | ICD-10-CM | POA: Diagnosis not present

## 2018-10-21 DIAGNOSIS — H16223 Keratoconjunctivitis sicca, not specified as Sjogren's, bilateral: Secondary | ICD-10-CM | POA: Diagnosis not present

## 2018-10-21 DIAGNOSIS — H5213 Myopia, bilateral: Secondary | ICD-10-CM | POA: Diagnosis not present

## 2018-10-21 DIAGNOSIS — H52223 Regular astigmatism, bilateral: Secondary | ICD-10-CM | POA: Diagnosis not present

## 2018-10-21 DIAGNOSIS — H524 Presbyopia: Secondary | ICD-10-CM | POA: Diagnosis not present

## 2018-10-21 DIAGNOSIS — H04121 Dry eye syndrome of right lacrimal gland: Secondary | ICD-10-CM | POA: Diagnosis not present

## 2018-10-21 DIAGNOSIS — H353131 Nonexudative age-related macular degeneration, bilateral, early dry stage: Secondary | ICD-10-CM | POA: Diagnosis not present

## 2018-10-31 DIAGNOSIS — Z471 Aftercare following joint replacement surgery: Secondary | ICD-10-CM | POA: Diagnosis not present

## 2018-10-31 DIAGNOSIS — M25561 Pain in right knee: Secondary | ICD-10-CM | POA: Diagnosis not present

## 2018-10-31 DIAGNOSIS — Z96652 Presence of left artificial knee joint: Secondary | ICD-10-CM | POA: Diagnosis not present

## 2018-10-31 DIAGNOSIS — M1711 Unilateral primary osteoarthritis, right knee: Secondary | ICD-10-CM | POA: Diagnosis not present

## 2018-12-15 MED ORDER — FLUOXETINE HCL 20 MG PO CAPS: 20.0000 mg | ORAL_CAPSULE | Freq: Every day | ORAL | 3 refills | Status: DC

## 2018-12-15 NOTE — Addendum Note (Signed)
Addended by: Mady Haagensen on: 12/15/2018 12:23 PM   Modules accepted: Orders

## 2018-12-23 ENCOUNTER — Other Ambulatory Visit: Payer: PPO | Admitting: Internal Medicine

## 2018-12-25 ENCOUNTER — Ambulatory Visit (INDEPENDENT_AMBULATORY_CARE_PROVIDER_SITE_OTHER): Payer: PPO | Admitting: Internal Medicine

## 2018-12-25 ENCOUNTER — Encounter: Payer: Self-pay | Admitting: Internal Medicine

## 2018-12-25 ENCOUNTER — Encounter: Payer: PPO | Admitting: Internal Medicine

## 2018-12-25 ENCOUNTER — Other Ambulatory Visit: Payer: Self-pay

## 2018-12-25 DIAGNOSIS — F439 Reaction to severe stress, unspecified: Secondary | ICD-10-CM

## 2018-12-25 DIAGNOSIS — H903 Sensorineural hearing loss, bilateral: Secondary | ICD-10-CM | POA: Diagnosis not present

## 2018-12-25 DIAGNOSIS — Z0000 Encounter for general adult medical examination without abnormal findings: Secondary | ICD-10-CM | POA: Diagnosis not present

## 2018-12-25 DIAGNOSIS — F411 Generalized anxiety disorder: Secondary | ICD-10-CM

## 2018-12-25 DIAGNOSIS — Z8669 Personal history of other diseases of the nervous system and sense organs: Secondary | ICD-10-CM | POA: Diagnosis not present

## 2018-12-25 NOTE — Progress Notes (Signed)
Subjective:    Patient ID: Faith Miles, female    DOB: [birth year], 73 y.o.   MRN: [id number]  HPI 73 year old Female seen for Medicare Annual Wellness Visit and evaluation of anxiety.  Patient tells me today that she will be moving to nearby [place] where she has relatives.  She is getting divorced.  She has purchased a new home.  Move will be within the next 6 months.  She will look for a new primary care provider.  She is status post left knee arthroplasty November 2019.  She realized during that time that she could be dependent on relatives if her health failed.  Her relatives came to [facility], realized she was struggling status post surgery, and took her back to the [place] area for rehab.  This helped tremendously.  She is now recovered well from the knee surgery.  Patient seen by interactive audio and visual telecommunications due to COVID-19 outbreak.  She agrees to this visit format today.  She is identified as Faith Miles by 2 identifiers, a longstanding patient in this practice.  Patient has history of anxiety depression related regarding situational stress with husband who is now at [facility] living facility with memory issues with history of alcohol abuse and also with her children who have relied on her for financial support sometimes and have had mental issues.  She is in the process of divorcing her husband.    Review of Systems no new complaints-seems very happy with her new plans  Would like to stay on SSRI medication which I think is wise.  Currently on Prozac 20 mg daily.  History of glaucoma treated with Xalatan  Tubal ligation 1992.  Tonsillectomy in childhood.  Left Bell's palsy August 2003.  Surgery for bilateral bunions by podiatrist in 2011.  History of bruxism and TMJ syndrome.  Social history: Occasionally smokes in the remote past but not for a number of years.  She has a Education officer, community.  Social alcohol consumption.   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.  Colonoscopy done January 2015.  Family history: Mother died at age 39 with Alzheimer's disease.  Father died at age 96 with diabetes and coronary disease.  Both parents with history of hypertension.  3 sisters in good health.  One daughter is bipolar.  One son in good health.     Objective:   Physical Exam  Not physically examined today as seen by virtual visit      Assessment & Plan:  Situational stress and depression treated successfully with SSRI medication.  She will continue on this.  Status post left knee arthroplasty now recovered well  Planning to move to [place], [place] area in the near future.  Currently staying there with relatives  Plan: Happy to transfer records to new provider when she is ready.  Subjective:   Patient presents for Medicare Annual/Subsequent preventive examination.  Review Past Medical/Family/Social: See above   Risk Factors  Current exercise habits: Has been going to rehab for knee arthroplasty.  Gets outside some. Dietary issues discussed: Low-fat low carbohydrate  Cardiac risk factors: Family history in father  Depression Screen  (Note: if answer to either of the following is "Yes", a more complete depression screening is indicated)   Over the past two weeks, have you felt down, depressed or hopeless? No  Over the past two weeks, have you felt little interest or pleasure in doing things? No Have you  lost interest or pleasure in daily life? No Do you often feel hopeless? No Do you cry easily over simple problems? No   Activities of Daily Living  In your present state of health, do you have any difficulty performing the following activities?:   Driving? No  Managing money? No  Feeding yourself? No  Getting from bed to chair? No  Climbing a flight of stairs? No  Preparing food and eating?: No  Bathing or showering? No  Getting dressed: No  Getting to  the toilet? No  Using the toilet:No  Moving around from place to place: No  In the past year have you fallen or had a near fall?:No  Are you sexually active? No  Do you have more than one partner? No   Hearing Difficulties: No  Do you often ask people to speak up or repeat themselves?  Yes wears hearing aids Do you experience ringing or noises in your ears?  Sometimes Do you have difficulty understanding soft or whispered voices?  Yes have hearing aids Do you feel that you have a problem with memory? No Do you often misplace items?  Rarely   Home Safety:  Do you have a smoke alarm at your residence? Yes Do you have grab bars in the bathroom?  Yes Do you have throw rugs in your house?  None   Cognitive Testing  Alert? Yes Normal Appearance?Yes  Oriented to person? Yes Place? Yes  Time? Yes  Recall of three objects? Yes  Can perform simple calculations? Yes  Displays appropriate judgment?Yes  Can read the correct time from a watch face?Yes   List the Names of Other Physician/Practitioners you currently use:  See referral list for the physicians patient is currently seeing.  Orthopedist   Review of Systems: See above  Objective:     General appearance: Appears stated age and trim.  Alert and oriented x3  Assessment:    Annual wellness medicare exam   Plan:    During the course of the visit the patient was educated and counseled about appropriate screening and preventive services including:   Annual mammogram  Annual flu vaccine     Patient Instructions (the written plan) was given to the patient.  Medicare Attestation  I have personally reviewed:  The patient's medical and social history  Their use of alcohol, tobacco or illicit drugs  Their current medications and supplements  The patient's functional ability including ADLs,fall risks, home safety risks, cognitive, and hearing and visual impairment  Diet and physical activities  Evidence for depression  or mood disorders  The patient's weight, height, BMI, and visual acuity have been recorded in the chart. I have made referrals, counseling, and provided education to the patient based on review of the above and I have provided the patient with a written personalized care plan for preventive services.

## 2019-01-07 NOTE — Patient Instructions (Signed)
It has been my pleasure to take care of you for many years.  Good luck with your move to the [facility] area.  We are happy to transfer your records to the physician of your choice with written permission.  Take care and doing well.

## 2019-02-19 ENCOUNTER — Encounter: Payer: PPO | Admitting: Internal Medicine

## 2019-02-24 ENCOUNTER — Other Ambulatory Visit: Payer: Self-pay

## 2019-02-24 ENCOUNTER — Other Ambulatory Visit: Payer: PPO | Admitting: Internal Medicine

## 2019-02-24 DIAGNOSIS — R5383 Other fatigue: Secondary | ICD-10-CM | POA: Diagnosis not present

## 2019-02-24 DIAGNOSIS — E7800 Pure hypercholesterolemia, unspecified: Secondary | ICD-10-CM

## 2019-02-24 LAB — COMPLETE METABOLIC PANEL WITH GFR
AG Ratio: 2 (calc) (ref 1.0–2.5)
ALT: 12 U/L (ref 6–29)
AST: 16 U/L (ref 10–35)
Albumin: 4.3 g/dL (ref 3.6–5.1)
Alkaline phosphatase (APISO): 71 U/L (ref 37–153)
BUN: 16 mg/dL (ref 7–25)
CO2: 30 mmol/L (ref 20–32)
Calcium: 9.6 mg/dL (ref 8.6–10.4)
Chloride: 102 mmol/L (ref 98–110)
Creat: 0.77 mg/dL (ref 0.60–0.93)
GFR, Est African American: 88 mL/min/{1.73_m2} (ref >=60)
GFR, Est Non African American: 76 mL/min/{1.73_m2} (ref >=60)
Globulin: 2.2 g/dL (calc) (ref 1.9–3.7)
Glucose, Bld: 89 mg/dL (ref 65–99)
Potassium: 4.9 mmol/L (ref 3.5–5.3)
Sodium: 138 mmol/L (ref 135–146)
Total Bilirubin: 1 mg/dL (ref 0.2–1.2)
Total Protein: 6.5 g/dL (ref 6.1–8.1)

## 2019-02-24 LAB — CBC WITH DIFFERENTIAL/PLATELET
Absolute Monocytes: 387 cells/uL (ref 200–950)
Basophils Absolute: 69 cells/uL (ref 0–200)
Basophils Relative: 1.3 %
Eosinophils Absolute: 170 cells/uL (ref 15–500)
Eosinophils Relative: 3.2 %
HCT: 42 % (ref 35.0–45.0)
Hemoglobin: 13.8 g/dL (ref 11.7–15.5)
Lymphs Abs: 1717 cells/uL (ref 850–3900)
MCH: 30.5 pg (ref 27.0–33.0)
MCHC: 32.9 g/dL (ref 32.0–36.0)
MCV: 92.9 fL (ref 80.0–100.0)
MPV: 10.5 fL (ref 7.5–12.5)
Monocytes Relative: 7.3 %
Neutro Abs: 2957 cells/uL (ref 1500–7800)
Neutrophils Relative %: 55.8 %
Platelets: 324 10*3/uL (ref 140–400)
RBC: 4.52 10*6/uL (ref 3.80–5.10)
RDW: 13 % (ref 11.0–15.0)
Total Lymphocyte: 32.4 %
WBC: 5.3 10*3/uL (ref 3.8–10.8)

## 2019-02-24 LAB — LIPID PANEL
Cholesterol: 224 mg/dL — ABNORMAL HIGH (ref <200)
HDL: 80 mg/dL (ref >=50)
LDL Cholesterol (Calc): 122 mg/dL (calc) — ABNORMAL HIGH
Non-HDL Cholesterol (Calc): 144 mg/dL (calc) — ABNORMAL HIGH (ref <130)
Total CHOL/HDL Ratio: 2.8 (calc) (ref <5.0)
Triglycerides: 117 mg/dL (ref <150)

## 2019-02-24 LAB — TSH: TSH: 2.2 mIU/L (ref 0.40–4.50)

## 2019-02-26 ENCOUNTER — Ambulatory Visit (INDEPENDENT_AMBULATORY_CARE_PROVIDER_SITE_OTHER): Payer: PPO | Admitting: Internal Medicine

## 2019-02-26 ENCOUNTER — Other Ambulatory Visit: Payer: Self-pay

## 2019-02-26 ENCOUNTER — Encounter: Payer: Self-pay | Admitting: Internal Medicine

## 2019-02-26 VITALS — BP 112/62 | HR 94 | Temp 97.8°F | Resp 16 | Ht 63.0 in | Wt 149.0 lb

## 2019-02-26 DIAGNOSIS — F324 Major depressive disorder, single episode, in partial remission: Secondary | ICD-10-CM | POA: Diagnosis not present

## 2019-02-26 DIAGNOSIS — H903 Sensorineural hearing loss, bilateral: Secondary | ICD-10-CM

## 2019-02-26 DIAGNOSIS — F411 Generalized anxiety disorder: Secondary | ICD-10-CM | POA: Diagnosis not present

## 2019-02-26 DIAGNOSIS — E7800 Pure hypercholesterolemia, unspecified: Secondary | ICD-10-CM | POA: Diagnosis not present

## 2019-02-26 DIAGNOSIS — Z0000 Encounter for general adult medical examination without abnormal findings: Secondary | ICD-10-CM | POA: Diagnosis not present

## 2019-02-26 DIAGNOSIS — Z8669 Personal history of other diseases of the nervous system and sense organs: Secondary | ICD-10-CM

## 2019-02-26 DIAGNOSIS — R319 Hematuria, unspecified: Secondary | ICD-10-CM

## 2019-02-26 DIAGNOSIS — F439 Reaction to severe stress, unspecified: Secondary | ICD-10-CM | POA: Diagnosis not present

## 2019-02-26 LAB — POCT URINALYSIS DIP (CLINITEK)
Glucose, UA: NEGATIVE mg/dL
Ketones, POC UA: NEGATIVE mg/dL
Leukocytes, UA: NEGATIVE
Nitrite, UA: NEGATIVE
POC PROTEIN,UA: NEGATIVE
Spec Grav, UA: >=1.03 — AB (ref 1.010–1.025)
Urobilinogen, UA: 0.2 E.U./dL
pH, UA: 5 (ref 5.0–8.0)

## 2019-02-26 MED ORDER — FLUOXETINE HCL 20 MG PO CAPS: 20.0000 mg | ORAL_CAPSULE | Freq: Every day | ORAL | 1 refills | Status: DC

## 2019-02-26 NOTE — Progress Notes (Signed)
Subjective:    Patient ID: Faith Miles, female    DOB: 12/06/1945, 73 y.o.   MRN: [id number]  HPI 73 year old Female moving to the [place] area in the very near future.  Had her annual Medicare wellness visit virtually in April.  Tells me her divorce is final.  She feels relieved about that.  She will be moving into a new home which is being readied for her now.  Residing alone but has family close by.  I will help her find a new primary care physician.  Her ex-husband remains at [facility] living facility. He has hx of alcohol abuse and memory issues.  In November 2019 she had total right knee arthroplasty by Dr. Alvan Dame.  She is now doing well from that.  She has a history of depression due to situational stress with husband and children.  She has a history of vitamin D deficiency and vitamin D supplement has been recommended.  She has a history of basal cell carcinoma right postauricular area and right leg diagnosed February 2013 followed by [facility].  Past medical history: History of anxiety, osteopenia, osteoarthritis of left knee.  Wears hearing aids both ears due to bilateral hearing loss.  Tubal ligation 1982.  Tonsillectomy in childhood.  History of glaucoma.  Left Bell's palsy August 2003.  Surgery for bilateral bunions by podiatrist in 2011.  History of bruxism and TMJ syndrome.  Social history: Now divorced.  Married twice.  She has a Education officer, community.  Social alcohol consumption.  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  Adult son and 1 adult daughter.  Daughter is bipolar.  Family history: Mother died at age 76 with Alzheimer's disease.  Father died at age 69 with diabetes and coronary disease.  Both parents with history of hypertension.  3 sisters in good health.  Colonoscopy done January 2015.     Review of Systems anxiety and situational stress improved.  She looks happy.     Objective:   Physical Exam  Vitals signs reviewed.  Constitutional:      Appearance: Normal appearance.  HENT:     Head: Normocephalic and atraumatic.     Right Ear: Tympanic membrane normal.     Left Ear: Tympanic membrane normal.     Ears:     Comments: Bilateral hearing aids    Nose: Nose normal.     Mouth/Throat:     Mouth: Mucous membranes are moist.     Pharynx: Oropharynx is clear.  Eyes:     General: No scleral icterus.       Right eye: No discharge.        Left eye: No discharge.     Extraocular Movements: Extraocular movements intact.     Conjunctiva/sclera: Conjunctivae normal.     Pupils: Pupils are equal, round, and reactive to light.  Neck:     Musculoskeletal: Neck supple. No neck rigidity.     Vascular: No carotid bruit.     Comments: No thyromegaly or bruits Cardiovascular:     Rate and Rhythm: Normal rate and regular rhythm.     Pulses: Normal pulses.     Heart sounds: Normal heart sounds. No murmur.  Pulmonary:     Effort: Pulmonary effort is normal. No respiratory distress.     Breath sounds: Normal breath sounds. No wheezing or rales.     Comments: Breast without masses Abdominal:     General: Bowel sounds are normal. There is  no distension.     Palpations: Abdomen is soft. There is no mass.     Tenderness: There is no right CVA tenderness, left CVA tenderness or guarding.  Genitourinary:    Comments: Bimanual normal Musculoskeletal:     Right lower leg: No edema.     Left lower leg: No edema.  Lymphadenopathy:     Cervical: No cervical adenopathy.  Skin:    General: Skin is warm and dry.  Neurological:     General: No focal deficit present.     Mental Status: She is alert and oriented to person, place, and time.     Cranial Nerves: No cranial nerve deficit.  Psychiatric:        Mood and Affect: Mood normal.        Behavior: Behavior normal.        Thought Content: Thought content normal.        Judgment: Judgment normal.           Assessment & Plan:  Normal  health maintenance exam  Pure hypercholesterolemia with total cholesterol of 224 and LDL cholesterol of 122.  She does not want to be on statin therapy  Anxiety and depression treated with Prozac and stable  History of vitamin D deficiency-recommend vitamin D supplement continue  Health maintenance- schedule mammogram in the near future.  Last mammogram June 2019 was normal.  History of mild osteopenia based on bone density study 2015 has not had repeat study.  Immunizations are up-to-date but has not had Shingrix vaccine.  Asked her to defer that until after the pandemic.  Plan: She will be transferring to a new primary care physician in the [facility] area later this year when her residence is ready.  It has been my pleasure to take care of her for many years.  Her urine dipstick showed occult blood but urine microscopic showed 0-2 red blood cells per high-powered field and urine culture showed no growth.

## 2019-02-26 NOTE — Patient Instructions (Signed)
It has been a pleasure to be your primary care provider for many years. We are happy to transfer your records to a physician of your choice at your new home in [place] when you are ready.

## 2019-03-01 LAB — URINE CULTURE
MICRO NUMBER:: 584007
Result:: NO GROWTH
SPECIMEN QUALITY:: ADEQUATE

## 2019-03-01 LAB — URINALYSIS, MICROSCOPIC ONLY

## 2019-03-04 DIAGNOSIS — R262 Difficulty in walking, not elsewhere classified: Secondary | ICD-10-CM | POA: Diagnosis not present

## 2019-03-04 DIAGNOSIS — M25661 Stiffness of right knee, not elsewhere classified: Secondary | ICD-10-CM | POA: Diagnosis not present

## 2019-03-04 DIAGNOSIS — M25561 Pain in right knee: Secondary | ICD-10-CM | POA: Diagnosis not present

## 2019-03-06 DIAGNOSIS — R262 Difficulty in walking, not elsewhere classified: Secondary | ICD-10-CM | POA: Diagnosis not present

## 2019-03-06 DIAGNOSIS — M25661 Stiffness of right knee, not elsewhere classified: Secondary | ICD-10-CM | POA: Diagnosis not present

## 2019-03-06 DIAGNOSIS — M25561 Pain in right knee: Secondary | ICD-10-CM | POA: Diagnosis not present

## 2019-03-11 DIAGNOSIS — R262 Difficulty in walking, not elsewhere classified: Secondary | ICD-10-CM | POA: Diagnosis not present

## 2019-03-11 DIAGNOSIS — M25561 Pain in right knee: Secondary | ICD-10-CM | POA: Diagnosis not present

## 2019-03-11 DIAGNOSIS — M25661 Stiffness of right knee, not elsewhere classified: Secondary | ICD-10-CM | POA: Diagnosis not present

## 2019-03-17 DIAGNOSIS — M25561 Pain in right knee: Secondary | ICD-10-CM | POA: Diagnosis not present

## 2019-03-17 DIAGNOSIS — R262 Difficulty in walking, not elsewhere classified: Secondary | ICD-10-CM | POA: Diagnosis not present

## 2019-03-17 DIAGNOSIS — M25661 Stiffness of right knee, not elsewhere classified: Secondary | ICD-10-CM | POA: Diagnosis not present

## 2019-03-19 DIAGNOSIS — M25661 Stiffness of right knee, not elsewhere classified: Secondary | ICD-10-CM | POA: Diagnosis not present

## 2019-03-19 DIAGNOSIS — R262 Difficulty in walking, not elsewhere classified: Secondary | ICD-10-CM | POA: Diagnosis not present

## 2019-03-19 DIAGNOSIS — M25561 Pain in right knee: Secondary | ICD-10-CM | POA: Diagnosis not present

## 2019-03-20 DIAGNOSIS — M1711 Unilateral primary osteoarthritis, right knee: Secondary | ICD-10-CM | POA: Diagnosis not present

## 2019-03-20 DIAGNOSIS — M25561 Pain in right knee: Secondary | ICD-10-CM | POA: Diagnosis not present

## 2019-03-20 DIAGNOSIS — Z96652 Presence of left artificial knee joint: Secondary | ICD-10-CM | POA: Diagnosis not present

## 2019-03-23 DIAGNOSIS — M25561 Pain in right knee: Secondary | ICD-10-CM | POA: Diagnosis not present

## 2019-03-23 DIAGNOSIS — M25661 Stiffness of right knee, not elsewhere classified: Secondary | ICD-10-CM | POA: Diagnosis not present

## 2019-03-23 DIAGNOSIS — R262 Difficulty in walking, not elsewhere classified: Secondary | ICD-10-CM | POA: Diagnosis not present

## 2019-03-27 DIAGNOSIS — M25661 Stiffness of right knee, not elsewhere classified: Secondary | ICD-10-CM | POA: Diagnosis not present

## 2019-03-27 DIAGNOSIS — R262 Difficulty in walking, not elsewhere classified: Secondary | ICD-10-CM | POA: Diagnosis not present

## 2019-03-27 DIAGNOSIS — M25561 Pain in right knee: Secondary | ICD-10-CM | POA: Diagnosis not present

## 2019-03-31 DIAGNOSIS — L821 Other seborrheic keratosis: Secondary | ICD-10-CM | POA: Diagnosis not present

## 2019-03-31 DIAGNOSIS — L570 Actinic keratosis: Secondary | ICD-10-CM | POA: Diagnosis not present

## 2019-03-31 DIAGNOSIS — Z85828 Personal history of other malignant neoplasm of skin: Secondary | ICD-10-CM | POA: Diagnosis not present

## 2019-03-31 DIAGNOSIS — L812 Freckles: Secondary | ICD-10-CM | POA: Diagnosis not present

## 2019-04-03 ENCOUNTER — Other Ambulatory Visit: Payer: Self-pay | Admitting: Internal Medicine

## 2019-04-30 DIAGNOSIS — R262 Difficulty in walking, not elsewhere classified: Secondary | ICD-10-CM | POA: Diagnosis not present

## 2019-04-30 DIAGNOSIS — M25561 Pain in right knee: Secondary | ICD-10-CM | POA: Diagnosis not present

## 2019-04-30 DIAGNOSIS — M25661 Stiffness of right knee, not elsewhere classified: Secondary | ICD-10-CM | POA: Diagnosis not present

## 2019-05-05 DIAGNOSIS — M25661 Stiffness of right knee, not elsewhere classified: Secondary | ICD-10-CM | POA: Diagnosis not present

## 2019-05-05 DIAGNOSIS — M25561 Pain in right knee: Secondary | ICD-10-CM | POA: Diagnosis not present

## 2019-05-05 DIAGNOSIS — R262 Difficulty in walking, not elsewhere classified: Secondary | ICD-10-CM | POA: Diagnosis not present

## 2019-05-07 DIAGNOSIS — M25561 Pain in right knee: Secondary | ICD-10-CM | POA: Diagnosis not present

## 2019-05-07 DIAGNOSIS — R262 Difficulty in walking, not elsewhere classified: Secondary | ICD-10-CM | POA: Diagnosis not present

## 2019-05-07 DIAGNOSIS — M25661 Stiffness of right knee, not elsewhere classified: Secondary | ICD-10-CM | POA: Diagnosis not present

## 2019-05-08 ENCOUNTER — Other Ambulatory Visit: Payer: Self-pay | Admitting: Internal Medicine

## 2019-05-08 DIAGNOSIS — F411 Generalized anxiety disorder: Secondary | ICD-10-CM

## 2019-05-08 MED ORDER — FLUOXETINE HCL 20 MG PO CAPS: 20.0000 mg | ORAL_CAPSULE | Freq: Every day | ORAL | 0 refills | Status: DC

## 2019-05-08 NOTE — Telephone Encounter (Signed)
Received Oceanographer request from  [facility], [place], [place] [address]  Medication - FLUoxetine (PROZAC) 20 MG capsule   Last Refill - 04/06/19  Last OV - 02/26/19  Last CPE - 02/26/19

## 2019-05-12 DIAGNOSIS — R262 Difficulty in walking, not elsewhere classified: Secondary | ICD-10-CM | POA: Diagnosis not present

## 2019-05-12 DIAGNOSIS — M25561 Pain in right knee: Secondary | ICD-10-CM | POA: Diagnosis not present

## 2019-05-12 DIAGNOSIS — M25661 Stiffness of right knee, not elsewhere classified: Secondary | ICD-10-CM | POA: Diagnosis not present

## 2019-05-14 DIAGNOSIS — M25561 Pain in right knee: Secondary | ICD-10-CM | POA: Diagnosis not present

## 2019-05-14 DIAGNOSIS — R262 Difficulty in walking, not elsewhere classified: Secondary | ICD-10-CM | POA: Diagnosis not present

## 2019-05-14 DIAGNOSIS — M25661 Stiffness of right knee, not elsewhere classified: Secondary | ICD-10-CM | POA: Diagnosis not present

## 2019-09-23 ENCOUNTER — Telehealth: Payer: Self-pay | Admitting: Internal Medicine

## 2019-09-23 NOTE — Telephone Encounter (Signed)
Faith Miles called to change her address and say that she has moved to [place] and has a new PCP Dr Jannett Celestine as of about a month ago.

## 2020-06-21 ENCOUNTER — Ambulatory Visit (INDEPENDENT_AMBULATORY_CARE_PROVIDER_SITE_OTHER): Payer: Medicare Other | Admitting: Internal Medicine

## 2020-06-21 ENCOUNTER — Other Ambulatory Visit: Payer: Self-pay

## 2020-06-21 ENCOUNTER — Encounter: Payer: Self-pay | Admitting: Internal Medicine

## 2020-06-21 ENCOUNTER — Telehealth: Payer: Self-pay | Admitting: Internal Medicine

## 2020-06-21 VITALS — BP 130/80 | HR 86 | Temp 98.8°F | Wt 155.0 lb

## 2020-06-21 DIAGNOSIS — T63441A Toxic effect of venom of bees, accidental (unintentional), initial encounter: Secondary | ICD-10-CM

## 2020-06-21 DIAGNOSIS — F411 Generalized anxiety disorder: Secondary | ICD-10-CM

## 2020-06-21 MED ORDER — DOXYCYCLINE HYCLATE 100 MG PO TABS: 100.0000 mg | ORAL_TABLET | Freq: Two times a day (BID) | ORAL | 0 refills | Status: AC

## 2020-06-21 MED ORDER — PREDNISONE 10 MG PO TABS: ORAL_TABLET | ORAL | 0 refills | Status: AC

## 2020-06-21 NOTE — Telephone Encounter (Signed)
I think we can see her.

## 2020-06-21 NOTE — Progress Notes (Signed)
   Subjective:    Patient ID: Faith Miles, female    DOB: [birth year], 74 y.o.   MRN: [id number]  HPI  74 year old Female visiting from [place], [place] and formerly a patient here prior to her move to [place] in June 2020  seen today at her request with a bee sting left first MCP joint that occurred on Saturday October 9th when reaching for a door. She tried topical therapy, but it has become redder and more itchy in the past day or so which has her concerned about possibility of infection.   General health is good.Hx left knee arthroplasty November 2019. Hx anxiety, depression and hearing loss both ears. She wears hearing aids. Hx of glaucoma.  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   Review of Systems no headache, fever, or chills. No nausea.     Objective:   Physical Exam BP 130/80, pulse 86, T. 98.8 degrees, pulse ox 97%  Dorsal aspect of left hand first MTP joint is erythematous and swollen extending up to first PIP joint.  Able to flex both joints. Palmar aspect of hand is erythematous.      Assessment & Plan:  Bee sting-  Left hand  Plan:Doxycycline 100 mg twice daily x 10 days.Tapering course of prednisone starting with 60 mg and decreasing be 10 mg daily 6-5-4-3-2-1 taper.

## 2020-06-21 NOTE — Telephone Encounter (Signed)
Scheduled

## 2020-06-21 NOTE — Telephone Encounter (Signed)
Faith Miles [phone]  Naria called to say she was in town visiting and has Clinical biochemist that she received on Saturday. It is on her Left hand Index finger and is very swollen and hot. She would like to come in and you look at it. I did not realize when she called that she had changed PCP's since she had moved to [place]. Should I have her go to Urgent Care?

## 2020-07-03 ENCOUNTER — Encounter: Payer: Self-pay | Admitting: Internal Medicine

## 2020-07-03 NOTE — Patient Instructions (Signed)
Take doxycycline 100 mg twice daily for 10 days.  Take prednisone in tapering course as directed.

## 2020-11-07 ENCOUNTER — Other Ambulatory Visit: Payer: Self-pay

## 2020-11-07 ENCOUNTER — Encounter (HOSPITAL_COMMUNITY): Payer: Self-pay | Admitting: Emergency Medicine

## 2020-11-07 ENCOUNTER — Ambulatory Visit (HOSPITAL_COMMUNITY)
Admission: EM | Admit: 2020-11-07 | Discharge: 2020-11-07 | Disposition: A | Discharge status: Home / Self Care | Payer: Medicare Other | Attending: Emergency Medicine | Admitting: Emergency Medicine

## 2020-11-07 DIAGNOSIS — G44019 Episodic cluster headache, not intractable: Secondary | ICD-10-CM

## 2020-11-07 NOTE — Discharge Instructions (Addendum)
Tylenol and ibuprofen may be alternated as needed to treat your pain.  Drink plenty of water.  Treat sinus symptoms as needed.  Please go to the ER for any worsening of symptoms- persistent headache, dizziness, vision changes, confusion, weakness, numbness or tingling or otherwise worsening Please follow up with your eye doctor and your PCP in the next week for recheck, particularly if symptoms persist

## 2020-11-07 NOTE — ED Provider Notes (Signed)
Faith Miles    CSN: [id number] Arrival date & time: 11/07/20  3329      History   Chief Complaint Chief Complaint  Patient presents with  . Eye Problem    HPI Faith Miles is a 75 y.o. female.   Faith Miles presents with complaints of right sided headache which started approximately 2/24. No head injury. No LOC. Feels sharp pain behind eye and to right lateral side of head. She has taken ibuprofen and pain subsides. No current pain. She is currently visiting from [place]. Denies any previous similar. Family history of migraines but no personal history of migraines. No vision changes or loss. No photophobia. No nausea or vomiting. She can wake up with the pain. No dizziness. No weakness, numbness or tingling. No history of clots, she is not on a blood thinner, no cardiac history. No dental or ear pain. No sinus symptoms, although she did try taking mucinex to see if this is helpful. History of glaucoma.     ROS per HPI, negative if not otherwise mentioned.      Past Medical History:  Diagnosis Date  . Arthritis   . Cataract    surgery  . Complication of anesthesia   . Glaucoma   . History of Bell's palsy   . Insomnia   . Osteopenia   . PONV (postoperative nausea and vomiting)     Patient Active Problem List   Diagnosis Date Noted  . Overweight (BMI 25.0-29.9) 07/25/2018  . S/P left TKA 07/24/2018  . Osteopenia 10/12/2012  . Dry eyes 10/12/2012  . Anxiety 10/12/2012  . TMJ syndrome 10/12/2012  . History of squamous cell carcinoma of skin 10/09/2012  . Hyperlipidemia 10/09/2012  . Hearing loss 10/09/2012    Past Surgical History:  Procedure Laterality Date  . BUNIONECTOMY  12/01   bilateral  . CATARACT EXTRACTION Bilateral 2010  . INJECTION KNEE Right 07/24/2018   Procedure: RIGHT KNEE INJECTION;  Surgeon: Paralee Cancel, MD;  Location: [facility];  Service: Orthopedics;  Laterality: Right;  . TONSILLECTOMY    . TOTAL KNEE ARTHROPLASTY Left  07/24/2018   Procedure: TOTAL KNEE ARTHROPLASTY;  Surgeon: Paralee Cancel, MD;  Location: [facility];  Service: Orthopedics;  Laterality: Left;  70 mins  . TUBAL LIGATION      OB History   No obstetric history on file.      Home Medications    Prior to Admission medications   Medication Sig Start Date End Date Taking? Authorizing Provider  cholecalciferol (VITAMIN D) 1000 UNITS tablet Take 1,000 Units by mouth daily.    [provider]  cycloSPORINE (RESTASIS) 0.05 % ophthalmic emulsion Place 1 drop into both eyes 2 (two) times daily.    [provider]  doxycycline (VIBRA-TABS) 100 MG tablet Take 1 tablet (100 mg total) by mouth 2 (two) times daily. 06/21/20   Elby Showers, MD  latanoprost (XALATAN) 0.005 % ophthalmic solution Place 1 drop into both eyes daily.    [provider]  Multiple Vitamins-Minerals (CENTRUM SILVER PO) Take 1 tablet by mouth daily.     [provider]  Omega-3 Fatty Acids (FISH OIL PO) Take 1 capsule by mouth daily.     [provider]  predniSONE (DELTASONE) 10 MG tablet TAKE IN TAPERING COURSE 6,5,4,3,2,1 06/21/20   Elby Showers, MD    Family History Family History  Problem Relation Age of Onset  . Diabetes Mother   . Heart disease Mother   .  Breast cancer Maternal Grandmother   . Colon cancer Neg Hx     Social History Social History   Tobacco Use  . Smoking status: Former Smoker    Types: Cigarettes    Quit date: 03/19/2001    Years since quitting: 19.6  . Smokeless tobacco: Never Used  Vaping Use  . Vaping Use: Never used  Substance Use Topics  . Alcohol use: Yes    Comment: occasional  . Drug use: No     Allergies   Amoxicillin-pot clavulanate and Morphine   Review of Systems Review of Systems   Physical Exam Triage Vital Signs ED Triage Vitals  Enc Vitals Group     BP 11/07/20 0831 (!) 95/59     Pulse Rate 11/07/20 0831 80     Resp 11/07/20 0831 17     Temp 11/07/20 0831 98.5  F (36.9 C)     Temp Source 11/07/20 0831 Oral     SpO2 11/07/20 0831 98 %     Weight --      Height --      Head Circumference --      Peak Flow --      Pain Score 11/07/20 0828 0     Pain Loc --      Pain Edu? --      Excl. in [place]? --    No data found.  Updated Vital Signs BP (!) 95/59 (BP Location: Right Arm)   Pulse 80   Temp 98.5 F (36.9 C) (Oral)   Resp 17   SpO2 98%   Visual Acuity Right Eye Distance: 20/30 Left Eye Distance: 20/40 Bilateral Distance: 20/40  Right Eye Near:   Left Eye Near:    Bilateral Near:     Physical Exam Constitutional:      General: She is not in acute distress.    Appearance: She is well-developed.  HENT:     Head: Normocephalic and atraumatic.     Nose: Nose normal.     Mouth/Throat:     Mouth: Mucous membranes are moist.  Eyes:     Extraocular Movements: Extraocular movements intact.     Pupils: Pupils are equal, round, and reactive to light.     Comments: Mild bilateral injection noted  Cardiovascular:     Rate and Rhythm: Normal rate.  Pulmonary:     Effort: Pulmonary effort is normal.  Musculoskeletal:     Cervical back: No rigidity.  Skin:    General: Skin is warm and dry.  Neurological:     General: No focal deficit present.     Mental Status: She is alert and oriented to person, place, and time. Mental status is at baseline.     Cranial Nerves: No cranial nerve deficit.     Sensory: No sensory deficit.     Motor: No weakness.     Coordination: Coordination normal.  Psychiatric:        Mood and Affect: Mood normal.      UC Treatments / Results  Labs (all labs ordered are listed, but only abnormal results are displayed) Labs Reviewed - No data to display  EKG   Radiology No results found.  Procedures Procedures (including critical care time)  Medications Ordered in UC Medications - No data to display  Initial Impression / Assessment and Plan / UC Course  I have reviewed the triage vital signs and  the nursing notes.  Pertinent labs & imaging results that were available during my care  of the patient were reviewed by me and considered in my medical decision making (see chart for details).     No current pain. Ibuprofen has been helpful. No red flag or neurological symptoms. Consistent with cluster headache. Patient verbalizes concerns about potential CVA, er precautions discussed. Low risk and no concerning findings here today. Patient verbalized understanding and agreeable to plan.  Ambulatory out of clinic without difficulty.    Final Clinical Impressions(s) / UC Diagnoses   Final diagnoses:  Episodic cluster headache, not intractable     Discharge Instructions     Tylenol and ibuprofen may be alternated as needed to treat your pain.  Drink plenty of water.  Treat sinus symptoms as needed.  Please go to the ER for any worsening of symptoms- persistent headache, dizziness, vision changes, confusion, weakness, numbness or tingling or otherwise worsening Please follow up with your eye doctor and your PCP in the next week for recheck, particularly if symptoms persist   ED Prescriptions    None     PDMP not reviewed this encounter.   Zigmund Gottron, NP 11/07/20 [phone]

## 2020-11-07 NOTE — ED Triage Notes (Signed)
Pt is present today with right eye pain that started last Thursday. Pt states that the pain is sharp and she feels pain also on the right side of her head. Pt states that she took a ibuprofen around 8 am his morning.  Pt states that she is not in pain right now but when she does feel pain the pain score is around a 6.
